# Patient Record
Sex: Female | Born: 1990 | Race: Black or African American | Hispanic: No | Marital: Single | Smoking: Never smoker
Health system: Southern US, Community
[De-identification: ages and names within clinical notes are randomized; demographics above are authoritative.]

---
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
## 2010-05-17 ENCOUNTER — Ambulatory Visit: Payer: Self-pay | Admitting: Family Medicine

## 2010-05-17 LAB — CONVERTED CEMR LAB

## 2010-06-02 ENCOUNTER — Encounter: Payer: Self-pay | Admitting: Family Medicine

## 2010-06-02 ENCOUNTER — Ambulatory Visit: Payer: Self-pay | Admitting: Family Medicine

## 2010-06-02 LAB — CONVERTED CEMR LAB
Chlamydia, DNA Probe: NEGATIVE
GC Probe Amp, Genital: NEGATIVE
Glucose, Urine, Semiquant: NEGATIVE
Nitrite: NEGATIVE
Urobilinogen, UA: 1
Whiff Test: POSITIVE

## 2010-06-04 ENCOUNTER — Encounter: Payer: Self-pay | Admitting: Family Medicine

## 2010-08-01 ENCOUNTER — Ambulatory Visit: Payer: Self-pay | Admitting: Family Medicine

## 2010-09-07 ENCOUNTER — Ambulatory Visit: Admission: RE | Admit: 2010-09-07 | Discharge: 2010-09-07 | Payer: Self-pay | Source: Home / Self Care

## 2010-10-05 NOTE — Assessment & Plan Note (Signed)
Summary: ? 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 Only one diflucan rx was given to pt.   Vital Signs:  Patient profile:   20 year old female Height:      62 inches Weight:      159 pounds BMI:     29.19 Temp:     98.3 degrees F oral Pulse rate:   76 / minute BP sitting:   110 / 72  (left arm) Cuff size:   regular  Vitals Entered By: Tessie Fass CMA (May 17, 2010 3:14 PM) CC: vag diacharge Is Patient Diabetic? No Pain Assessment Patient in pain? no        Primary Provider:  . [facility]  CC:  vag diacharge.  History of Present Illness: Pt has been having vaginal discharge for 1-2 months, but was initially trying natural remedies such as yogurt, which did not seem to treat the problem. She has some vaginal itching and some discharge. Noncopious, but very foul smelling when she has it. Yellow in color. She is sexually active with one partner and uses condoms. She has no conerns for STDs. Has not noticed any lesions or warts. She was last treated for BV this past spring and has previously had both yeast and BV infections.   Current Problems (verified): 1)  Bacterial Vaginitis  (ICD-616.10) 2)  Candidiasis of Vulva and Vagina  (ICD-112.1) 3)  Vaginal Discharge  (ICD-623.5) 4)  Contact or Exposure To Other Viral Diseases  (ICD-V01.79) 5)  Contraceptive Management  (ICD-V25.09) 6)  Preventive Health Care  (ICD-V70.0) 7)  Eczema, Atopic Dermatitis  (ICD-691.8) 8)  Acne  (ICD-706.1)  Current Medications (verified): 1)  Sprintec 28 0.25-35 Mg-Mcg Tabs (Norgestimate-Eth Estradiol) .... One Daily 2)  Fluconazole 150 Mg Tabs (Fluconazole) .... Take One Pill By Mouth Today 3)  Metronidazole 500 Mg Tabs (Metronidazole) .Marland Kitchen.. 1 Tab By Mouth Two Times A Day X7 Days  Allergies (verified): No Known Drug Allergies  Physical Exam  General:  Well-developed,well-nourished,in no acute distress; alert,appropriate  and cooperative throughout examination Mouth:  good dentition and pharynx pink and moist.   Lungs:  normal respiratory effort and normal breath sounds.   Heart:  normal rate, regular rhythm, and no murmur.   Abdomen:  soft, non-tender, normal bowel sounds, no distention, and no masses.   Genitalia:  normal introitus, no external lesions, mucosa pink and moist, no vaginal or cervical lesions, normal uterus size and position, no adnexal masses or tenderness, and vaginal discharge.     Impression & Recommendations:  Problem # 1:  BACTERIAL VAGINITIS (ICD-616.10) Assessment New Clue cells on wet prep consistent with BV. Pt has had previously.   The following medications were removed from the medication list:    Metrogel-vaginal 0.75 % Gel (Metronidazole) ..... One applicatorful (37.5mg ) in vagina two times a day x 5 days.  qs Her updated medication list for this problem includes:    Metronidazole 500 Mg Tabs (Metronidazole) .Marland Kitchen... 1 tab by mouth two times a day x7 days  Orders: [facility]- Est Level  3 (99213) [facility]- Est Level  3 (99213)  Problem # 2:  CANDIDIASIS OF VULVA AND VAGINA (ICD-112.1) Assessment: New Yeast on wet prep.   The following medications were removed from the medication list:    Fluconazole 150 Mg Tabs (Fluconazole) .Marland Kitchen... 1 by mouth x 1, may repeat in 3 days if symptoms unchanged Her updated medication list for this problem includes:    Fluconazole 150 Mg  Tabs (Fluconazole) .Marland Kitchen... Take one pill by mouth today  Orders: Wet Prep- [facility] (87210) [facility]- Est Level  3 (99213) [facility]- Est Level  3 (11914)  Problem # 3:  VAGINAL DISCHARGE (ICD-623.5)  Wet prep with yeast and clue cells. Treating with diflucan 150mg  by mouth x1 and metronidazole 500mg  by mouth two times a day x7days.   No concern for STDs per pt. Pt denied wanting STD testing.   Orders: [facility]- Est Level  3 (99213) [facility]- Est Level  3 (78295)  The following medications were removed from the medication list:     Metrogel-vaginal 0.75 % Gel (Metronidazole) ..... One applicatorful (37.5mg ) in vagina two times a day x 5 days.  qs  Problem # 4:  CONTRACEPTIVE MANAGEMENT (ICD-V25.09) Assessment: Unchanged Pt never filled last Sprintec Rx but would like to try it now. Had previously been using NuvaRing, but wants OCP.   Complete Medication List: 1)  Sprintec 28 0.25-35 Mg-mcg Tabs (Norgestimate-eth estradiol) .... One daily 2)  Fluconazole 150 Mg Tabs (Fluconazole) .... Take one pill by mouth today 3)  Metronidazole 500 Mg Tabs (Metronidazole) .Marland Kitchen.. 1 tab by mouth two times a day x7 days  Patient Instructions: 1)  It looks like you have both a yeast infection and bacterial vaginosis. I am giving you two medicines. The one that you take one pill one time may take a few days to start working. Please take the other medicine twice a day for the entire 7 days. 2)  Take your birth control pill at the same time every day in order to prevent pregnancy.  Birth control pills do not prevent STDs so continue to use condoms.  Prescriptions: FLUCONAZOLE 150 MG TABS (FLUCONAZOLE) Take one pill by mouth today  #1 x 0   Entered and Authorized by:   Demetria Pore MD   Signed by:   Demetria Pore MD on 05/17/2010   Method used:   Print then Give to Patient   RxID:   [id number] FLUCONAZOLE 150 MG TABS (FLUCONAZOLE) Take one pill by mouth today  #1 x 0   Entered and Authorized by:   Demetria Pore MD   Signed by:   Demetria Pore MD on 05/17/2010   Method used:   Print then Give to Patient   RxID:   [id number] SPRINTEC 28 0.25-35 MG-MCG TABS (NORGESTIMATE-ETH ESTRADIOL) one daily  #1 x 11   Entered and Authorized by:   Demetria Pore MD   Signed by:   Demetria Pore MD on 05/17/2010   Method used:   Print then Give to Patient   RxID:   [id number] METRONIDAZOLE 500 MG TABS (METRONIDAZOLE) 1 tab by mouth two times a day x7 days  #14 x 0   Entered and Authorized by:   Demetria Pore MD    Signed by:   Demetria Pore MD on 05/17/2010   Method used:   Print then Give to Patient   RxID:   [id number] FLUCONAZOLE 150 MG TABS (FLUCONAZOLE) Take one pill by mouth today  #1 x 0   Entered and Authorized by:   Demetria Pore MD   Signed by:   Demetria Pore MD on 05/17/2010   Method used:   Electronically to        [facility] [address] #[id number]* (retail)       [address] [place], [place]  [address]       Ph: [phone]       Fax:  [phone]   RxID:   [id number]   Laboratory Results  Date/Time Received: May 17, 2010 3:53 PM  Date/Time Reported: May 17, 2010 4:03 PM   [employer] Source: vaginal WBC/hpf: 1-5 Bacteria/hpf: 3+  Cocci Clue cells/hpf: moderate  Positive whiff Yeast/hpf: few Trichomonas/hpf: none Comments: ...........test performed by...........Marland KitchenTerese Door, CMA

## 2010-10-05 NOTE — Assessment & Plan Note (Signed)
Summary: yeast inf,df   Vital Signs:  Patient profile:   20 year old female Height:      62 inches Weight:      159 pounds BMI:     29.19 Temp:     98.7 degrees F oral Pulse rate:   69 / minute BP sitting:   105 / 68  (left arm) Cuff size:   regular  Vitals Entered By: Tessie Fass CMA (November 23, 2009 9:50 AM) CC: yeast infection? Is Patient Diabetic? No Pain Assessment Patient in pain? no        Primary Care Provider:  Eustaquio Boyden  MD  CC:  yeast infection?.  History of Present Illness: CC: yeast?  2 wks ago, odor and itchy vaginally.  + white clunky discharge.  No abx recently.  No h/o STIs.  Not on BC.  Currently sexually active, using protection.  Has had 3 yeast/BV infections in last year.  Habits & Providers  Alcohol-Tobacco-Diet     Tobacco Status: never  Allergies (verified): No Known Drug Allergies  Past History:  Past Medical History: Reviewed history from 05/05/2008 and no changes required. h/o severe ear infx-R ear  h/o recurrent yeast and BV infections  Physical Exam  General:  Well-developed,well-nourished,in no acute distress; alert,appropriate and cooperative throughout examination Genitalia:  Normal introitus for age, no external lesions, + white vaginal discharge, mucosa pink and moist, no vaginal or cervical lesions, no vaginal atrophy, no friaility or hemorrhage, normal uterus size and position, no adnexal masses or tenderness   Impression & Recommendations:  Problem # 1:  VAGINAL DISCHARGE (ICD-623.5)  wet prep consistent with mostly BV but candida.  tx with diflucan x 2.  metronidazole as well.  rtc as needed. Orders: Wet Prep- [facility] [id number]) [facility]- Est Level  3 ([id number])  Her updated medication list for this problem includes:    Metrogel-vaginal 0.75 % Gel (Metronidazole) ..... One applicatorful (37.5mg ) in vagina two times a day x 5 days.  qs  Problem # 2:  CONTRACEPTIVE MANAGEMENT (ICD-V25.09) discussed improtance of BC to  prevent pregnancies.  Complete Medication List: 1)  Fluconazole 150 Mg Tabs (Fluconazole) .Marland Kitchen.. 1 by mouth x 1, may repeat in 3 days if symptoms unchanged 2)  Sprintec 28 0.25-35 Mg-mcg Tabs (Norgestimate-eth estradiol) .... One daily 3)  Metrogel-vaginal 0.75 % Gel (Metronidazole) .... One applicatorful (37.5mg ) in vagina two times a day x 5 days.  qs  Patient Instructions: 1)  Looks like you have another vaginal yeast infection. 2)  Treat with diflucan x 1, may repeat in 4 days. 3)  Call clinic with questions. 4)  Good to see you today. Prescriptions: METROGEL-VAGINAL 0.75 % GEL (METRONIDAZOLE) one applicatorful (37.5mg ) in vagina two times a day x 5 days.  QS  #1 x 0   Entered and Authorized by:   Eustaquio Boyden  MD   Signed by:   Eustaquio Boyden  MD on 11/23/2009   Method used:   Electronically to        [facility] [address] #[id number]* (retail)       [address] [place], [place]  [address]       Ph: [phone]       Fax: [phone]   RxID:   [id number] FLUCONAZOLE 150 MG TABS (FLUCONAZOLE) 1 by mouth x 1, may repeat in 3 days if symptoms unchanged  #2 x 0   Entered and Authorized by:   Eustaquio Boyden  MD  Signed by:   Eustaquio Boyden  MD on 11/23/2009   Method used:   Electronically to        [facility] [address] #[id number]* (retail)       [address] [place], [place]  [address]       Ph: [phone]       Fax: [phone]   RxID:   [id number]   Laboratory Results  Date/Time Received: November 23, 2009 10:15 AM  Date/Time Reported: November 23, 2009 10:35 AM   Wet [place] Source: vag WBC/hpf: >20 Bacteria/hpf: 3+  Cocci Clue cells/hpf: many  Positive whiff Yeast/hpf: few Trichomonas/hpf: none Comments: ...............test performed by......Marland KitchenBonnie A. [place], MLS (ASCP)cm

## 2010-10-05 NOTE — Assessment & Plan Note (Signed)
Summary: confirm pregnancy/eo   Vital Signs:  Patient profile:   20 year old female LMP:     07/04/2010 Height:      62 inches Weight:      153 pounds BMI:     28.09 Pulse rate:   77 / minute BP sitting:   116 / 68  (right arm) Cuff size:   regular  Vitals Entered By: Tessie Fass CMA (September 07, 2010 1:43 PM) CC: upreg Pain Assessment Patient in pain? no      LMP (date): 07/04/2010 EDC by LMP==> 04/10/2011 EDC 04/10/2011 LMP - Character: normal LMP - Reliable? Yes     On BCP's at conception: yes Date of + home preg. test: 08/03/2010 Enter LMP: 07/04/2010   Primary Care Provider:  . [facility]  CC:  upreg.  History of Present Illness: Leslie Hopkins presents to clinic today to discuss her positive home pregnancy test.  She had her LMP at the end of October and early november. She tested positive by self pregnancy test on Dec1st. She was using birth control pills at the time of concecption however she was not taking them reliably. She is not sure if she wants to keep her baby but has not called planned parenthood yet.  This is her first pregnancy.   Habits & Providers  Alcohol-Tobacco-Diet     Tobacco Status: current  Current Problems (verified): 1)  Pregnant State, Incidental  (ICD-V22.2) 2)  Contact or Exposure To Other Viral Diseases  (ICD-V01.79) 3)  Contraceptive Management  (ICD-V25.09) 4)  Preventive Health Care  (ICD-V70.0) 5)  Eczema, Atopic Dermatitis  (ICD-691.8) 6)  Acne  (ICD-706.1)  Current Medications (verified): 1)  Metrogel-Vaginal 0.75 % Gel (Metronidazole) .... One Applicator At Bedtime For 3-5 Nights As Needed For Symptoms 2)  Prenatal Vitamins 0.8 Mg Tabs (Prenatal Multivit-Min-Fe-Fa)  Allergies (verified): No Known Drug Allergies  Past History:  Past Medical History: Last updated: 05/05/2008 h/o severe ear infx-R ear  h/o recurrent yeast and BV infections  Family History: Last updated: 10/31/2006 no h/o CAD, DM,  Ca  Social History: Last updated: 09/16/2008 Lives with mom, siblings, GM, aunt, uncle and cousin.  lived in [place] and [place] growing up.  No EtOH, smoking, rec drugs, sexually active since 2007, in monogamous relationship, uses protection ShantEEa  Risk Factors: Smoking Status: current (09/07/2010)  Review of Systems  The patient denies anorexia, fever, weight loss, chest pain, syncope, dyspnea on exertion, abdominal pain, severe indigestion/heartburn, difficulty walking, and abnormal bleeding.    Physical Exam  General:  Well-developed,well-nourished,in no acute distress; alert,appropriate and cooperative throughout examination Psych:  Cognition and judgment appear intact. Alert and cooperative with normal attention span and concentration. No apparent delusions, illusions, hallucinations   Impression & Recommendations:  Problem # 1:  PREGNANT STATE, INCIDENTAL (ICD-V22.2) Assessment New  Discussed at length the options in early pregnancy. She is currently undecided if she will have an abortion. I gave her the contact details for planned parenthood and asked her to talk to the people over there about all the options.   She will return to clinic in a few weeks after making her decision.  Is she decides to keep the baby she will come back for prenatal labs and starting prenatal care.  Is she decides to have an abortion she will come back for contraception management.  She is thinking about the implinon.   Orders: [facility]- Est Level  3 (04540)  Complete Medication List: 1)  Metrogel-vaginal 0.75 %  Gel (Metronidazole) .... One applicator at bedtime for 3-5 nights as needed for symptoms 2)  Prenatal Vitamins 0.8 Mg Tabs (Prenatal multivit-min-fe-fa)  Other Orders: U Preg[facility] ([id number])  Patient Instructions: 1)  Thank you for seeing me today. 2)  Please call planned parenthood to talk about your options.  3)  Start taking prenatal vitamins now or if they make you sick you can  take 2 gummy vitamins or 2 flintstones vitamins.  4)  Once you have made your decision come one back to either start prenatal care or to choose a more effective form of birth control.    Orders Added: 1)  [facility] Preg[facility] [[id number]] 2)  [facility]- Est Level  3 [99213]    Laboratory Results   Urine Tests  Date/Time Received: September 07, 2010 1:45 PM  Date/Time Reported: September 07, 2010 1:47 PM     Urine HCG: positive Comments: ...........test performed by...........Marland KitchenTerese Door, CMA       Flowsheet View for Follow-up Visit    Estimated weeks of       gestation:     9 2/7    Weight:     153    Blood pressure:   116 / 68  Prenatal Visit EDC Confirmation:    New working [facility]: 04/10/2011    LMP reliable? Yes    Last menses onset (LMP) date: 07/04/2010    EDC by LMP: 04/10/2011   Past Pregnancy History    Gravida:     1    Para:       0  Pregnancy # 1    Comments:     Current   OB Initial Intake Information    Positive HCG by: school    Race: Black    Marital status: Single    Number of children at home: 0  Menstrual History    LMP (date): 07/04/2010    EDC by LMP: 04/10/2011    Best Working EDC: 04/10/2011    LMP - Character: normal    LMP - Reliable? : Yes    On BCP's at conception: yes    Date of positive (+) home preg. test: 08/03/2010

## 2010-10-05 NOTE — Assessment & Plan Note (Signed)
Summary: bv?,df   Vital Signs:  Patient profile:   20 year old female Height:      62 inches Weight:      157.2 pounds BMI:     28.86 Temp:     98.5 degrees F oral Pulse rate:   69 / minute BP sitting:   116 / 65  (left arm) Cuff size:   regular  Vitals Entered By: Jimmy Footman, CMA (August 01, 2010 3:12 PM) CC: bv sxs for 5 days Is Patient Diabetic? No Pain Assessment Patient in pain? no        Primary Care Chevelle Coulson:  . [facility]  CC:  bv sxs for 5 days.  History of Present Illness: recurrent BV, has mainly used oral antibotics, she tried the gel once and did not like it.  generally gets yeast when she is treated.  Boyfriend in room, she made him accompany her, he did not want to.  Habits & Providers  Alcohol-Tobacco-Diet     Tobacco Status: current  Current Medications (verified): 1)  Sprintec 28 0.25-35 Mg-Mcg Tabs (Norgestimate-Eth Estradiol) .... One Daily 2)  Metrogel-Vaginal 0.75 % Gel (Metronidazole) .... One Applicator At Bedtime For 3-5 Nights As Needed For Symptoms  Allergies: No Known Drug Allergies  Social History: Smoking Status:  current  Physical Exam  General:  Well-developed,well-nourished,in no acute distress; alert,appropriate and cooperative throughout examination Genitalia:  Normal introitus for age, no external lesions, no vaginal discharge, mucosa pink and moist, no vaginal or cervical lesions, no vaginal atrophy, no friaility or hemorrhage, normal uterus size and position, no adnexal masses or tenderness   Impression & Recommendations:  Problem # 1:  UNSPECIFIED VAGINITIS AND VULVOVAGINITIS (ICD-616.10) Very few clues on wet mount, discussed patient is focusing too much on this.  Tried to discuss as chronic in nature and comes and goes.  Trial of Metrogel to treat herself when she becomes symptomatic.  Explained need to drink water and keep urine flushed. The following medications were removed from the medication list:    Cleocin  300 Mg Caps (Clindamycin hcl) .Marland Kitchen... 1 cap by mouth two times a day x 7 days Her updated medication list for this problem includes:    Metrogel-vaginal 0.75 % Gel (Metronidazole) ..... One applicator at bedtime for 3-5 nights as needed for symptoms  Orders: Wet Prep- [facility] (87210) [facility]- Est Level  3 (23557)  Complete Medication List: 1)  Sprintec 28 0.25-35 Mg-mcg Tabs (Norgestimate-eth estradiol) .... One daily 2)  Metrogel-vaginal 0.75 % Gel (Metronidazole) .... One applicator at bedtime for 3-5 nights as needed for symptoms  Patient Instructions: 1)  Drink more water 2)  use the gel as directed Prescriptions: METROGEL-VAGINAL 0.75 % GEL (METRONIDAZOLE) one applicator at bedtime for 3-5 nights as needed for symptoms  #1 x 1   Entered and Authorized by:   Luretha Murphy NP   Signed by:   Luretha Murphy NP on 08/01/2010   Method used:   Electronically to        [facility] [address] [id number]* (retail)       [address] [place], [place]  [address]       Ph: [phone]       Fax: [phone]   RxID:   [id number]    Orders Added: 1)  Wet Prep- [facility] [87210] 2)  [facility]- Est Level  3 [[id number]]    Laboratory Results  Date/Time Received: August 01, 2010 3:50 PM  Date/Time Reported:  August 01, 2010 4:06 PM   [employer] Source: vaginal WBC/hpf: 10-15 Bacteria/hpf: 3+ Clue cells/hpf: few Yeast/hpf: few Trichomonas/hpf: none Comments: ...........test performed by...........Marland KitchenTerese Door, CMA

## 2010-10-05 NOTE — Assessment & Plan Note (Signed)
Summary: ? bacterial infection,tcb   Vital Signs:  Patient profile:   20 year old female Height:      62 inches Weight:      160.4 pounds BMI:     29.44 Temp:     98.5 degrees F oral Pulse rate:   66 / minute BP sitting:   112 / 66  (left arm) Cuff size:   regular  Vitals Entered By: Garen Grams LPN (June 02, 2010 11:32 AM) CC: still having discharge with odor and cloudy urine Is Patient Diabetic? No Pain Assessment Patient in pain? no        Primary Care Provider:  . [facility]  CC:  still having discharge with odor and cloudy urine.  History of Present Illness: 20 y/o F here for vaginal odor.   Malodorous vaginal discharge x 3 days.  Treated 2 wks ago for BV + Yeast.  No puritis.  No pain with sex.  NO abd pain, fever, chills.  Sexually active with inconsistent condom use.  Just found out that boyfriend has cheated on her.     Urinary frequencey:  Urine with cloudy color.  No dysuria.  + frequency.  No abd pain, fever, chills.    Contraception: OCP, just started taking it.  Does not desire fertility at this time.    Habits & Providers  Alcohol-Tobacco-Diet     Tobacco Status: never  Current Medications (verified): 1)  Sprintec 28 0.25-35 Mg-Mcg Tabs (Norgestimate-Eth Estradiol) .... One Daily 2)  Cleocin 300 Mg Caps (Clindamycin Hcl) .Marland Kitchen.. 1 Cap By Mouth Two Times A Day X 7 Days  Allergies (verified): No Known Drug Allergies  Past History:  Past Medical History: Last updated: 05/05/2008 h/o severe ear infx-R ear  h/o recurrent yeast and BV infections  Family History: Last updated: 10/31/2006 no h/o CAD, DM, Ca  Social History: Last updated: 09/16/2008 Lives with mom, siblings, GM, aunt, uncle and cousin.  lived in [place] and [place] growing up.  No EtOH, smoking, rec drugs, sexually active since 2007, in monogamous relationship, uses protection Leslie Hopkins  Risk Factors: Smoking Status: never (06/02/2010)  Review of Systems General:   Denies chills and fever. GI:  Denies abdominal pain, bloody stools, nausea, and vomiting. GU:  Complains of discharge and hematuria; denies abnormal vaginal bleeding, dysuria, and genital sores.  Physical Exam  General:  Well-developed,well-nourished,in no acute distress; alert,appropriate and cooperative throughout examination. vitals reviewed.  Genitalia:  Normal introitus for age, no external lesions, no vaginal discharge, mucosa pink and moist, no vaginal or cervical lesions, no vaginal atrophy, no friaility or hemorrhage, normal uterus size and position, no adnexal masses or tenderness   Impression & Recommendations:  Problem # 1:  BACTERIAL VAGINITIS (ICD-616.10) Wet prep still with clue cells.  Pt has been treated with Flagyl on 05/17/10 and in the past.  Will treat with clindamycien this time.   Orders: Wet Prep- [facility] [phone]) [facility]- Est  Level 4 (60454)  The following medications were removed from the medication list:    Metronidazole 500 Mg Tabs (Metronidazole) .Marland Kitchen... 1 tab by mouth two times a day x7 days Her updated medication list for this problem includes:    Cleocin 300 Mg Caps (Clindamycin hcl) .Marland Kitchen... 1 cap by mouth two times a day x 7 days  Problem # 2:  CANDIDIASIS OF VULVA AND VAGINA (ICD-112.1) Assessment: Improved None on wet prep.  The following medications were removed from the medication list:    Fluconazole 150 Mg Tabs (  Fluconazole) .Marland Kitchen... Take one pill by mouth today  Orders: Wet Prep- [facility] [phone]) GC/Chlamydia-[facility] (87591/87491) HIV-[facility] (32440-10272) RPR-[facility] [phone]) [facility]- Est  Level 4 (42595)  Problem # 3:  URINARY FREQUENCY (ICD-788.41) Assessment: New UA: negative.  UPT negative.  Likely from BV.   Orders: Urinalysis-[facility] (00000) U Preg-[facility] (81025) [facility]- Est  Level 4 (99214)  Problem # 4:  CONTACT OR EXPOSURE TO OTHER VIRAL DISEASES (ICD-V01.79) Assessment: New Just found out that boyfriend was having relationship with another lady.  Will check for  GC/Chlam, HIV, RPR.   Orders: [facility]- Est  Level 4 (99214)  Complete Medication List: 1)  Sprintec 28 0.25-35 Mg-mcg Tabs (Norgestimate-eth estradiol) .... One daily 2)  Cleocin 300 Mg Caps (Clindamycin hcl) .Marland Kitchen.. 1 cap by mouth two times a day x 7 days Prescriptions: CLEOCIN 300 MG CAPS (CLINDAMYCIN HCL) 1 cap by mouth two times a day x 7 days  #14 x 0   Entered and Authorized by:   Angeline Slim MD   Signed by:   Angeline Slim MD on 06/02/2010   Method used:   Electronically to        [facility] [address] [id number] (retail)       [address] [place], [place]  [address]       Ph: [phone]       Fax: [phone]   RxID:   [id number]   Laboratory Results   Urine Tests  Date/Time Received: June 02, 2010 11:58 AM  Date/Time Reported: June 02, 2010 2:59 PM   Routine Urinalysis   Color: yellow Appearance: Clear Glucose: negative   (Normal Range: Negative) Bilirubin: negative   (Normal Range: Negative) Ketone: small (15)   (Normal Range: Negative) Spec. Gravity: >=1.030   (Normal Range: 1.003-1.035) Blood: trace-lysed   (Normal Range: Negative) pH: 6.0   (Normal Range: 5.0-8.0) Protein: trace   (Normal Range: Negative) Urobilinogen: 1.0   (Normal Range: 0-1) Nitrite: negative   (Normal Range: Negative) Leukocyte Esterace: negative   (Normal Range: Negative)  Urine Microscopic WBC/HPF: 5-10 RBC/HPF: 1-4 Bacteria/HPF: 3+ Mucous/HPF: 3+ Epithelial/HPF: 1-5    Urine HCG: negative Comments: ...............test performed by......Marland KitchenBonnie A. Swaziland, MLS (ASCP)cm  Date/Time Received: June 02, 2010 11:58 PM  Date/Time Reported: June 02, 2010 12:12 PM   [employer] Source: vaginal WBC/hpf: 5-10 Bacteria/hpf: 3+ Clue cells/hpf: few  Positive whiff Yeast/hpf: none Trichomonas/hpf: none Comments: ...........test performed by...........Marland KitchenTerese Door, CMA

## 2010-10-05 NOTE — Letter (Signed)
Summary: Lab Result  [facility]  [address]   [place], [place] [address]   Phone: [phone]  Fax: [phone]    06/04/2010  LEXA CORONADO [address] [place], [place]  [address]  Dear Ms. Henion,  Your lab results are:  Chlamydia:  Negative  Gonorrhea: Negative  HIV: Negative  Syphilis:  Negative     Sincerely,   Cat Ta MD

## 2010-10-22 ENCOUNTER — Emergency Department (HOSPITAL_COMMUNITY)
Admission: EM | Admit: 2010-10-22 | Discharge: 2010-10-22 | Disposition: A | Discharge status: Home / Self Care | Payer: Self-pay | Attending: Emergency Medicine | Admitting: Emergency Medicine

## 2010-10-23 ENCOUNTER — Encounter: Payer: Self-pay | Admitting: *Deleted

## 2010-11-06 ENCOUNTER — Other Ambulatory Visit: Payer: Self-pay | Admitting: Family Medicine

## 2010-11-06 ENCOUNTER — Encounter: Payer: Self-pay | Admitting: Family Medicine

## 2010-11-06 ENCOUNTER — Ambulatory Visit (INDEPENDENT_AMBULATORY_CARE_PROVIDER_SITE_OTHER): Payer: Medicaid Other | Admitting: Family Medicine

## 2010-11-06 VITALS — BP 110/70 | HR 72 | Wt 159.7 lb

## 2010-11-06 DIAGNOSIS — N898 Other specified noninflammatory disorders of vagina: Secondary | ICD-10-CM

## 2010-11-06 DIAGNOSIS — IMO0001 Reserved for inherently not codable concepts without codable children: Secondary | ICD-10-CM

## 2010-11-06 DIAGNOSIS — Z309 Encounter for contraceptive management, unspecified: Secondary | ICD-10-CM | POA: Insufficient documentation

## 2010-11-06 LAB — POCT WET PREP (WET MOUNT): Trichomonas Wet Prep HPF POC: NEGATIVE

## 2010-11-06 LAB — CONVERTED CEMR LAB: Chlamydia, DNA Probe: NEGATIVE

## 2010-11-06 MED ORDER — FLUCONAZOLE 150 MG PO TABS: 150.0000 mg | ORAL_TABLET | Freq: Once | ORAL | Status: AC

## 2010-11-06 MED ORDER — NORETHIN ACE-ETH ESTRAD-FE 1-20 MG-MCG(24) PO TABS: 1.0000 | ORAL_TABLET | Freq: Every day | ORAL | Status: DC

## 2010-11-06 MED ORDER — METRONIDAZOLE 500 MG PO TABS: 500.0000 mg | ORAL_TABLET | Freq: Two times a day (BID) | ORAL | Status: AC

## 2010-11-06 NOTE — Assessment & Plan Note (Signed)
Wet prep, GC/Chlam done today.  Wet prep showed moderate clue cells and yeast.  Will treat with flagyl 500mg  bid x 7 days and Diflucan 150mg  x 1.

## 2010-11-06 NOTE — Assessment & Plan Note (Signed)
UPT negative today.  Will place pt on Loestrin 24 daily.  She can start on it today.  She will make appt for Implanon insertion.  Will give reading material on Implanon.

## 2010-11-06 NOTE — Progress Notes (Signed)
  Subjective:    Patient ID: Leslie Hopkins, female    DOB: [birth year], 20 y.o.   MRN: [id number]  HPI Vaginal discharge: 1 1/2 weeks.  Had an elective abortion in mid Jaunuary 2012.  Afterwards she had bleeding and cramping x 1 wk.  She had menses end of Feb, LMP 10/28/10, finished 3 days ago.  She noticed vaginal discharge right before period started.  She has history of BV and it felt like that.  She is sexually active, and denies pain with intercourse.  Some itchiness.  No abd vaginal bleeding.    Contraception: She is taking OCP.  She was given one month of OCP from the abortion clinic.  She desires Implanon for longterm contraception.  She does not desire pregnancy as she is in college at this time.     Review of Systems No fever, chills, nausea, vomiting, abd pain.      Objective:   Physical Exam GEN: nad, alert, appropriate throughout exam GU: Normal introitus for age, no external lesions, no vaginal discharge, mucosa pink and moist, no vaginal or cervical lesions, no vaginal atrophy, no friaility or hemorrhage, normal uterus size and position, no adnexal masses or tenderness +vaginal discharge    Assessment & Plan:

## 2010-11-06 NOTE — Patient Instructions (Signed)
Please make a follow-up appointment for Insertion of Implanon.  This will require at least a 30 minute appointment.  You may want to take some Advil 1 hour prior to appointment time.   IMPORTANT: HOW TO USE THIS INFORMATION:  This is a summary and does NOT have all possible information about this product. This information does not assure that this product is safe, effective, or appropriate for you. This information is not individual medical advice and does not substitute for the advice of your health care professional. Always ask your health care professional for complete information about this product and your specific health needs.    ETONOGESTREL - IMPLANT (et-oh-no-GES-trel)    COMMON BRAND NAME(S): Implanon    USES:  This medication is used to prevent pregnancy. It is a thin plastic rod about the size of a matchstick that is inserted under the skin by a health care professional. The rod slowly releases etonogestrel into the body over a 3-year period. The rod must be removed after 3 years and can be replaced if continued birth control is desired. The rod can be removed at any time by a trained health care professional if birth control is no longer desired or there are side effects. It does not contain any estrogen. Etonogestrel (a form of progestin) is a hormone that prevents pregnancy by preventing the release of an egg (ovulation) and by changing the womb and cervical mucus to make it more difficult for an egg to meet sperm (fertilization) or for the fertilized egg to attach to the wall of the womb (implantation). This medication may not work as well in women who are very overweight or those taking certain drugs. (See also Drug Interactions section.) Discuss your birth control options with your doctor. Using this medication does not protect you or your partner against sexually transmitted diseases (e.g., HIV, gonorrhea).    HOW TO USE:  Read the Patient Information Leaflet provided by your pharmacist  or health care provider before the rod is placed. Read and sign the Informed Consent provided by your doctor. You will also be given a User Card with the date and the place on your body where the rod was inserted. Keep the card and use it to remind yourself when to schedule an appointment to have the rod removed. If you have any questions, ask your doctor or pharmacist. Ask your doctor about the best time to schedule your appointment to have the rod placed. Your doctor may want you to have a pregnancy test first. The medication usually starts working right away when the rod is inserted on days 1 through 5 after the start of your regular menstrual bleeding. If your appointment is at another time in your menstrual cycle, you may need to use a non-hormonal form of birth control (e.g., condoms, diaphragm, spermicide) for the first 7 days after the rod is placed. Ask your doctor about whether you need back-up birth control. The rod will be inserted into the skin in your upper arm. Usually it will be placed in the arm opposite the side you write with. Be sure you can feel the rod underneath your skin after it has been placed. There will be 2 bandages covering the area where the rod is placed. Leave the top bandage on for 24 hours. Keep the smaller bandage on for 3-5 days or as directed by your doctor. Keep the bandage clean and dry.    SIDE EFFECTS:  Nausea, stomach cramping/bloating, dizziness, headache, breast tenderness, acne,  hair loss, weight gain, and vaginal irritation/discharge may occur. Pain, bruising, numbness, infection, and scarring may occur at the site where the rod is placed. If any of these effects persist or worsen, notify your doctor promptly. Your periods may be early or late, shorter or longer, heavier or lighter than normal. You may also have some spotting between periods, especially during the first several months of use. If bleeding is prolonged (more than 8 days) or unusually heavy, contact your  doctor. If you miss 2 periods in a row, contact your doctor for a pregnancy test. Remember that your doctor has prescribed this medication because he or she has judged that the benefit to you is greater than the risk of side effects. Many people using this medication do not have serious side effects. The rod must be removed after 3 years. This is usually a simple procedure done in your doctor's office. Rarely (e.g., if the rod has been placed too deeply or can't be felt), the rod may require surgery to remove. Tell your doctor immediately if any of these unlikely but serious side effects occur: depression, unwanted facial/body hair. This medication may rarely cause serious (sometimes fatal) problems from blood clots (e.g., pulmonary embolism, stroke, heart attack). Seek immediate medical attention if you experience: sudden shortness of breath, chest/jaw/left arm pain, confusion, coughing up blood, sudden dizziness/fainting, pain/swelling/warmth in the groin/calf, tingling/weakness/numbness in the arms/legs, headaches that are different from those you may have experienced in the past (e.g., headaches with other symptoms such as vision changes/lack of coordination, existing migraines becoming worse, sudden/very severe headaches), slurred speech, weakness on one side of the body, vision problems/changes. Tell your doctor immediately if any of these rare but very serious side effects occur: severe stomach/abdominal/pelvic pain, lumps in the breast, unusual tiredness, dark urine, yellowing eyes/skin. A very serious allergic reaction to this drug is rare. However, seek immediate medical attention if you notice any symptoms of a serious allergic reaction, including: rash, itching/swelling (especially of the face/tongue/throat), severe dizziness, trouble breathing. This is not a complete list of possible side effects. If you notice other effects not listed above, contact your doctor or pharmacist. In the [place] - Call your  doctor for medical advice about side effects. You may report side effects to FDA at 1-800-FDA-1088. In [place] - Call your doctor for medical advice about side effects. You may report side effects to Health [place] at [phone].    PRECAUTIONS:  Before having the rod placed, tell your doctor or pharmacist if you are allergic to etonogestrel; or to other progestins; or to any anesthetics or antiseptics that might be used in the procedure; or if you have any other allergies. This product may contain inactive ingredients, which can cause allergic reactions or other problems. Talk to your pharmacist for more details. This medication should not be used if you have certain medical conditions. Before using this medicine, consult your doctor or pharmacist if you have: abnormal breast exam, breast cancer, heart attack/stroke/other blood clots (e.g., in the legs, eyes, lungs), liver problems (e.g., liver tumor, active liver disease), current or suspected pregnancy, unexplained vaginal bleeding. Before using this medication, tell your doctor or pharmacist your medical history, especially of: depression, high blood pressure, low levels of "good" cholesterol (HDL), diabetes, gall bladder disease, heart disease (e.g., chest pain), history of yellowing eyes/skin (jaundice) during pregnancy or while using birth control pills, migraine headaches, seizures, long periods of sitting or lying down (e.g., immobility such as being bedridden). Smoking cigarettes/using tobacco while  using hormonal birth control (implant/pill/patch/ring) increases your risk of heart problems and stroke. Do not smoke. The risk of heart problems increases with age (especially in women over 32) and with frequent smoking (15 or more cigarettes a day). Notify your doctor at least 4 weeks beforehand if you will be having surgery or will be confined to a chair/bed for a long time (e.g., a long plane flight). You may need to have this medication removed  temporarily or take special precautions at these times while you are using this drug. The drug in this implant may cause blotchy, dark areas on your skin (melasma). Sunlight may intensify this effect. If this occurs, avoid prolonged sun exposure, use a sunscreen, and wear protective clothing when outdoors. If you are nearsighted or wear contact lenses, you may develop vision problems or may have problems wearing your contact lenses. Contact your eye doctor if these problems occur. This product should not be used during pregnancy. If you become pregnant or think you may be pregnant, inform your doctor immediately. A certain serious pregnancy problem (ectopic pregnancy) may be more likely if you become pregnant while using this product. This medication passes into breast milk in small amounts. Consult your doctor before breast-feeding.    DRUG INTERACTIONS:  Your doctor or pharmacist may already be aware of any possible drug interactions and may be monitoring you for them. Do not start, stop, or change the dosage of any medicine before checking with your doctor or pharmacist first. This drug should not be used with the following medication because very serious interactions may occur: sodium tetradecyl sulfate. If you are currently using the medication listed above, tell your doctor or pharmacist before starting this medication. Before using this medication, tell your doctor or pharmacist of all prescription and nonprescription/herbal products you may use, especially of: azole antifungals (e.g., fluconazole, itraconazole). Certain drugs can decrease the effectiveness of birth control by decreasing the amount of birth control hormones in your system. This can result in pregnancy. These drugs include: aprepitant, bexarotene, bosentan, dapsone, felbamate, griseofulvin, certain HIV drugs (such as amprenavir, indinavir, nelfinavir, nevirapine, ritonavir, zidovudine), modafinil, phenylbutazone, rifamycins (e.g., rifampin),  many seizure medications (e.g., barbiturates, carbamazepine, phenytoin, topiramate), St. John's wort. Consult your doctor or pharmacist for details, and ask if you should use additional reliable birth control methods while taking any of the drugs listed above. This medication can affect the results of certain lab tests (e.g., sex-hormone-binding globulin, thyroid). Make sure laboratory personnel and all your doctors know you use this medication. This document does not contain all possible interactions. Therefore, before using this product, tell your doctor or pharmacist of all the products you use. Keep a list of all your medications with you, and share the list with your doctor and pharmacist.    OVERDOSE:  This medication may be harmful if swallowed. Overdose may occur if more than 1 rod is placed or the old rod is not removed when a new rod is placed. If overdose is suspected, contact your local poison control center or emergency room immediately. [place] residents can call the [place] National Poison Hotline at (830) 585-6005. [place] residents can call a Technical sales engineer center.    NOTES:  Keep all laboratory and medical appointments. You should have regular complete physical exams including blood pressure, breast exam, pelvic exam, and screening for cervical cancer (Pap smear). Follow your doctor's instructions for examining your own breasts, and report any lumps immediately. Consult your doctor for more details.    MISSED  DOSE:  The rod must be removed or replaced after 3 years.    STORAGE:  Store at room temperature at 77 degrees F (25 degrees C) away from light and moisture until insertion. Brief storage between 59-86 degrees F (15-30 degrees C) is permitted. Do not store in the bathroom. Keep all medicines away from children and pets. Do not flush medications down the toilet or pour them into a drain unless instructed to do so. Properly discard this product when it is expired or no longer needed.  Consult your pharmacist or local waste disposal company for more details about how to safely discard your product.    Information last revised September 2010 Copyright(c) 2010 [employer].

## 2010-11-09 LAB — GC/CHLAMYDIA PROBE AMP, GENITAL: Chlamydia, DNA Probe: NEGATIVE

## 2010-11-10 ENCOUNTER — Telehealth: Payer: Self-pay | Admitting: Family Medicine

## 2010-11-10 NOTE — Telephone Encounter (Signed)
Tried to call pt regarding GC/Chlam result but phone is not working. Will send letter.

## 2010-11-23 ENCOUNTER — Ambulatory Visit: Payer: Medicaid Other | Admitting: Family Medicine

## 2010-12-07 ENCOUNTER — Ambulatory Visit: Payer: Medicaid Other | Admitting: Family Medicine

## 2011-01-02 ENCOUNTER — Ambulatory Visit: Payer: Medicaid Other | Admitting: Family Medicine

## 2011-01-11 ENCOUNTER — Ambulatory Visit: Payer: Medicaid Other | Admitting: Family Medicine

## 2011-01-12 ENCOUNTER — Ambulatory Visit: Payer: Medicaid Other | Admitting: Family Medicine

## 2011-01-22 ENCOUNTER — Ambulatory Visit (INDEPENDENT_AMBULATORY_CARE_PROVIDER_SITE_OTHER): Payer: Medicaid Other | Admitting: Family Medicine

## 2011-01-22 ENCOUNTER — Other Ambulatory Visit: Payer: Self-pay | Admitting: Family Medicine

## 2011-01-22 ENCOUNTER — Encounter: Payer: Self-pay | Admitting: Family Medicine

## 2011-01-22 VITALS — BP 104/76 | HR 67 | Temp 97.7°F | Ht 62.6 in | Wt 161.0 lb

## 2011-01-22 DIAGNOSIS — N898 Other specified noninflammatory disorders of vagina: Secondary | ICD-10-CM

## 2011-01-22 DIAGNOSIS — B373 Candidiasis of vulva and vagina: Secondary | ICD-10-CM

## 2011-01-22 DIAGNOSIS — A499 Bacterial infection, unspecified: Secondary | ICD-10-CM

## 2011-01-22 DIAGNOSIS — B3731 Acute candidiasis of vulva and vagina: Secondary | ICD-10-CM | POA: Insufficient documentation

## 2011-01-22 DIAGNOSIS — B9689 Other specified bacterial agents as the cause of diseases classified elsewhere: Secondary | ICD-10-CM | POA: Insufficient documentation

## 2011-01-22 DIAGNOSIS — N760 Acute vaginitis: Secondary | ICD-10-CM

## 2011-01-22 DIAGNOSIS — M549 Dorsalgia, unspecified: Secondary | ICD-10-CM | POA: Insufficient documentation

## 2011-01-22 LAB — POCT WET PREP (WET MOUNT): Trichomonas Wet Prep HPF POC: NEGATIVE

## 2011-01-22 MED ORDER — FLUCONAZOLE 150 MG PO TABS: 150.0000 mg | ORAL_TABLET | Freq: Once | ORAL | Status: AC

## 2011-01-22 MED ORDER — METRONIDAZOLE 500 MG PO TABS: 500.0000 mg | ORAL_TABLET | Freq: Two times a day (BID) | ORAL | Status: AC

## 2011-01-22 NOTE — Telephone Encounter (Signed)
Message copied by Jimmy Footman on Mon Jan 22, 2011  1:43 PM ------      Message from: Clotilde Dieter, [place]      Created: Mon Jan 22, 2011 11:55 AM      Regarding: Please call this patient.        I have been trying to call her twice to let her know that I have sent in 2 prescriptions for her to treat BV and yeast infection. I can't get through. Can you try to call?             ----- Message -----         From: Bonnie Swaziland         Sent: 01/22/2011  11:13 AM           To: Jamie Brookes

## 2011-01-22 NOTE — Telephone Encounter (Signed)
Patient to call back

## 2011-01-22 NOTE — Assessment & Plan Note (Addendum)
Pt has a h/o BV. Some recent d/c x 3 weeks, thick mucus, no recent treatment with antibiotics, no h/o STI's, using pills for birth control and condoms sometimes. Had abortion in Jan. No new sexual partners.   Pt found to have BV and a yeast infection.

## 2011-01-22 NOTE — Assessment & Plan Note (Signed)
Pt has a h/o BV. Some recent d/c x 3 weeks, thick mucus, no recent treatment with antibiotics, no h/o STI's, using pills for birth control and condoms sometimes. Had abortion in Jan. No new sexual partners.   Pt found to have BV and a yeast infection.  

## 2011-01-22 NOTE — Assessment & Plan Note (Addendum)
Pt has a h/o BV. Some recent d/c x 3 weeks, thick mucus, no recent treatment with antibiotics, no h/o STI's, using pills for birth control and condoms sometimes. Had abortion in Jan. No new sexual partners.   Pt found to have BV on wet prep.

## 2011-01-22 NOTE — Progress Notes (Signed)
Vaginitis: Pt has had a discharge since the end of April. She has  A thick mucus. No new sexual partners. No h/o STI's, uses pills for birth control, uses condoms sometimes . Pt had an abortion in January. No problems. No fever.   Back pain: Pt has had some back pain since December when she was about 2 months pregnant. She thought it would go away after the abortion but she continues to have back pain. She has not lifted anything heavy, no injury, no pain in the spine. It has been hurting for 5 months now. Hurts worst when laying in the bed and going from laying to sitting or sitting to laying down. No bowel or bladder involvelment.   ROS:  Neg except as noted above.   PE:  Gen: NAD, sitting comfortably.  Back:No tenderness to palpation on the spine or paraspinal muscles. No SI joint tenderness.  ZS:WFUXN appears normal, no lesions, no abnormalities. Some scan white discharge with clumps.

## 2011-01-22 NOTE — Assessment & Plan Note (Signed)
No bowel or bladder involvement. No injury.  Pt has gained a few pounds but nothing significant. Back pain started after finding out she was pregnant but only carried the pregnancy to almost 3 months before terminating the pregnancy.  Back pain worse when laying down and getting up out of bed. No pain over spine.  Would like PT.

## 2011-01-22 NOTE — Telephone Encounter (Signed)
Spoke with patient and informed her.Leslie Hopkins

## 2011-01-22 NOTE — Patient Instructions (Signed)
We will call you with results.  I have put in a physical therapy referral for you.  We will work on setting up a time and date for you and let you know when your appointment it to take place.

## 2011-01-22 NOTE — Telephone Encounter (Signed)
Message copied by Jimmy Footman on Mon Jan 22, 2011  1:42 PM ------      Message from: Clotilde Dieter, [place]      Created: Mon Jan 22, 2011 11:55 AM      Regarding: Please call this patient.        I have been trying to call her twice to let her know that I have sent in 2 prescriptions for her to treat BV and yeast infection. I can't get through. Can you try to call?             ----- Message -----         From: Bonnie Swaziland         Sent: 01/22/2011  11:13 AM           To: Jamie Brookes

## 2011-01-23 ENCOUNTER — Telehealth: Payer: Self-pay | Admitting: Family Medicine

## 2011-01-23 ENCOUNTER — Telehealth: Payer: Self-pay | Admitting: *Deleted

## 2011-01-23 LAB — GC/CHLAMYDIA PROBE AMP, GENITAL: GC Probe Amp, Genital: NEGATIVE

## 2011-01-23 NOTE — Telephone Encounter (Signed)
Due to patient's phone number being out of service I have called the emergency contact number and left a message for patient to call back. When and if she calls back please give her below message. If I am unable to reach her, a letter will be sent to her.Leslie Hopkins

## 2011-01-23 NOTE — Telephone Encounter (Signed)
Pt call and given msg.  Will call back to sched appt. Advise to get in asap

## 2011-01-23 NOTE — Telephone Encounter (Signed)
Wanted to let the patient know that she also had Chlamydia and needs to come in for treatment of Azithromycin 2 g PO x 1 . Will have the [employer] try to contact her.

## 2011-01-23 NOTE — Telephone Encounter (Signed)
Message copied by Jimmy Footman on Tue Jan 23, 2011  3:02 PM ------      Message from: Jamie Brookes      Created: Tue Jan 23, 2011  2:11 PM      Regarding: contact this patient.        This patient has Chlamydia and needs to be treated with Azithro 2 g PO x 1 here in the clinic. I tried to call but could not get through. If you know how to get a hold of her please let her know she needs to come in.       Thanks,      Hospital doctor            ----- Message -----         From: Bonnie Swaziland         Sent: 01/22/2011  11:13 AM           To: Jamie Brookes

## 2011-01-26 ENCOUNTER — Encounter: Payer: Medicaid Other | Admitting: *Deleted

## 2011-01-26 DIAGNOSIS — A749 Chlamydial infection, unspecified: Secondary | ICD-10-CM | POA: Insufficient documentation

## 2011-01-26 NOTE — Assessment & Plan Note (Signed)
Pt is to be treated with 1 g slurry of azithro. (Not 2 g as previously ordered). For a chalmydia infection.

## 2011-01-26 NOTE — Progress Notes (Signed)
This encounter was created in error - please disregard.

## 2011-01-30 ENCOUNTER — Telehealth: Payer: Self-pay | Admitting: Family Medicine

## 2011-01-30 NOTE — Telephone Encounter (Signed)
Gave information to Lyn and she will forward to [facility], due to pt not coming in for Tx.Laureen Ochs, Viann Shove

## 2011-01-30 NOTE — Telephone Encounter (Signed)
Notified Cheryln Manly with [facility] Dept  that we are unable to contact patient. She had appointment 10/28/2010 but did not come in for treatment of Chlamydia. Report and Communicable Disease  form faxed.

## 2011-01-31 ENCOUNTER — Ambulatory Visit: Payer: Medicaid Other

## 2011-02-01 ENCOUNTER — Ambulatory Visit (INDEPENDENT_AMBULATORY_CARE_PROVIDER_SITE_OTHER): Payer: Medicaid Other | Admitting: *Deleted

## 2011-02-01 DIAGNOSIS — A7489 Other chlamydial diseases: Secondary | ICD-10-CM

## 2011-02-01 DIAGNOSIS — A749 Chlamydial infection, unspecified: Secondary | ICD-10-CM

## 2011-02-01 MED ORDER — AZITHROMYCIN 1 G PO PACK
1.0000 g | PACK | Freq: Once | ORAL | Status: AC
  Administered 2011-02-01: 1 g via ORAL

## 2011-02-01 NOTE — Progress Notes (Signed)
Patient in office for STD treatment. Communicable Disease report faxed to [facility] Dept.   Dr. Clotilde Dieter had entered in order for Azithromycin 2 grams  PO on 01/23/2011,  however she verbally gave order today for Azithromycin 1 gram,  which is the usual dose,  to RN today.Leslie Hopkins

## 2011-02-12 ENCOUNTER — Emergency Department (HOSPITAL_COMMUNITY)
Admission: EM | Admit: 2011-02-12 | Discharge: 2011-02-12 | Disposition: A | Discharge status: Home / Self Care | Payer: Medicaid Other | Attending: Emergency Medicine | Admitting: Emergency Medicine

## 2011-02-12 DIAGNOSIS — N12 Tubulo-interstitial nephritis, not specified as acute or chronic: Secondary | ICD-10-CM | POA: Insufficient documentation

## 2011-02-12 DIAGNOSIS — R109 Unspecified abdominal pain: Secondary | ICD-10-CM | POA: Insufficient documentation

## 2011-02-12 DIAGNOSIS — R6883 Chills (without fever): Secondary | ICD-10-CM | POA: Insufficient documentation

## 2011-02-12 LAB — URINALYSIS, ROUTINE W REFLEX MICROSCOPIC
Bilirubin Urine: NEGATIVE
Ketones, ur: NEGATIVE mg/dL
Nitrite: POSITIVE — AB
Protein, ur: 100 mg/dL — AB
Specific Gravity, Urine: 1.013 (ref 1.005–1.030)
Urobilinogen, UA: 1 mg/dL (ref 0.0–1.0)
pH: 7 (ref 5.0–8.0)

## 2011-02-12 LAB — URINE MICROSCOPIC-ADD ON

## 2011-02-13 LAB — URINE CULTURE
Colony Count: >=100000
Culture  Setup Time: 201206110518

## 2011-04-11 ENCOUNTER — Ambulatory Visit: Payer: Medicaid Other | Admitting: Family Medicine

## 2011-05-22 ENCOUNTER — Emergency Department (HOSPITAL_COMMUNITY)
Admission: EM | Admit: 2011-05-22 | Discharge: 2011-05-22 | Discharge status: Against Medical Advice | Payer: Medicaid Other | Attending: Emergency Medicine | Admitting: Emergency Medicine

## 2011-05-22 DIAGNOSIS — H53149 Visual discomfort, unspecified: Secondary | ICD-10-CM | POA: Insufficient documentation

## 2011-05-22 DIAGNOSIS — R51 Headache: Secondary | ICD-10-CM | POA: Insufficient documentation

## 2011-05-22 DIAGNOSIS — R110 Nausea: Secondary | ICD-10-CM | POA: Insufficient documentation

## 2011-05-22 DIAGNOSIS — H538 Other visual disturbances: Secondary | ICD-10-CM | POA: Insufficient documentation

## 2011-05-22 DIAGNOSIS — H5710 Ocular pain, unspecified eye: Secondary | ICD-10-CM | POA: Insufficient documentation

## 2011-11-01 ENCOUNTER — Ambulatory Visit: Payer: Medicaid Other | Admitting: Family Medicine

## 2011-11-02 ENCOUNTER — Encounter: Payer: Self-pay | Admitting: Family Medicine

## 2011-11-02 ENCOUNTER — Ambulatory Visit (INDEPENDENT_AMBULATORY_CARE_PROVIDER_SITE_OTHER): Payer: Medicaid Other | Admitting: Family Medicine

## 2011-11-02 ENCOUNTER — Other Ambulatory Visit: Payer: Self-pay | Admitting: Family Medicine

## 2011-11-02 ENCOUNTER — Other Ambulatory Visit (HOSPITAL_COMMUNITY)
Admission: RE | Admit: 2011-11-02 | Discharge: 2011-11-02 | Disposition: A | Discharge status: Home / Self Care | Payer: Medicaid Other | Source: Ambulatory Visit | Attending: Family Medicine | Admitting: Family Medicine

## 2011-11-02 DIAGNOSIS — B9689 Other specified bacterial agents as the cause of diseases classified elsewhere: Secondary | ICD-10-CM

## 2011-11-02 DIAGNOSIS — N72 Inflammatory disease of cervix uteri: Secondary | ICD-10-CM

## 2011-11-02 DIAGNOSIS — Z113 Encounter for screening for infections with a predominantly sexual mode of transmission: Secondary | ICD-10-CM | POA: Insufficient documentation

## 2011-11-02 DIAGNOSIS — N898 Other specified noninflammatory disorders of vagina: Secondary | ICD-10-CM

## 2011-11-02 LAB — POCT WET PREP (WET MOUNT): Clue Cells Wet Prep Whiff POC: POSITIVE

## 2011-11-02 MED ORDER — METRONIDAZOLE 500 MG PO TABS: 500.0000 mg | ORAL_TABLET | Freq: Two times a day (BID) | ORAL | Status: AC

## 2011-11-02 MED ORDER — CEFTRIAXONE SODIUM 1 G IJ SOLR
250.0000 mg | Freq: Once | INTRAMUSCULAR | Status: AC
  Administered 2011-11-02: 250 mg via INTRAMUSCULAR

## 2011-11-02 MED ORDER — AZITHROMYCIN 1 G PO PACK
1.0000 g | PACK | Freq: Once | ORAL | Status: AC
  Administered 2011-11-02: 1 g via ORAL

## 2011-11-02 NOTE — Progress Notes (Signed)
Addended byArlyss Repress on: 11/02/2011 10:34 AM   Modules accepted: Orders

## 2011-11-02 NOTE — Patient Instructions (Signed)
Bacterial Vaginosis Bacterial vaginosis (BV) is a vaginal infection where the normal balance of bacteria in the vagina is disrupted. The normal balance is then replaced by an overgrowth of certain bacteria. There are several different kinds of bacteria that can cause BV. BV is the most common vaginal infection in women of childbearing age. CAUSES   The cause of BV is not fully understood. BV develops when there is an increase or imbalance of harmful bacteria.   Some activities or behaviors can upset the normal balance of bacteria in the vagina and put women at increased risk including:   Having a new sex partner or multiple sex partners.   Douching.   Using an intrauterine device (IUD) for contraception.   It is not clear what role sexual activity plays in the development of BV. However, women that have never had sexual intercourse are rarely infected with BV.  Women do not get BV from toilet seats, bedding, swimming pools or from touching objects around them.  SYMPTOMS   Grey vaginal discharge.   A fish-like odor with discharge, especially after sexual intercourse.   Itching or burning of the vagina and vulva.   Burning or pain with urination.   Some women have no signs or symptoms at all.  DIAGNOSIS  Your caregiver must examine the vagina for signs of BV. Your caregiver will perform lab tests and look at the sample of vaginal fluid through a microscope. They will look for bacteria and abnormal cells (clue cells), a pH test higher than 4.5, and a positive amine test all associated with BV.  RISKS AND COMPLICATIONS   Pelvic inflammatory disease (PID).   Infections following gynecology surgery.   Developing HIV.   Developing herpes virus.  TREATMENT  Sometimes BV will clear up without treatment. However, all women with symptoms of BV should be treated to avoid complications, especially if gynecology surgery is planned. Female partners generally do not need to be treated. However,  BV may spread between female sex partners so treatment is helpful in preventing a recurrence of BV.   BV may be treated with antibiotics. The antibiotics come in either pill or vaginal cream forms. Either can be used with nonpregnant or pregnant women, but the recommended dosages differ. These antibiotics are not harmful to the baby.   BV can recur after treatment. If this happens, a second round of antibiotics will often be prescribed.   Treatment is important for pregnant women. If not treated, BV can cause a premature delivery, especially for a pregnant woman who had a premature birth in the past. All pregnant women who have symptoms of BV should be checked and treated.   For chronic reoccurrence of BV, treatment with a type of prescribed gel vaginally twice a week is helpful.  HOME CARE INSTRUCTIONS   Finish all medication as directed by your caregiver.   Do not have sex until treatment is completed.   Tell your sexual partner that you have a vaginal infection. They should see their caregiver and be treated if they have problems, such as a mild rash or itching.   Practice safe sex. Use condoms. Only have 1 sex partner.  PREVENTION  Basic prevention steps can help reduce the risk of upsetting the natural balance of bacteria in the vagina and developing BV:  Do not have sexual intercourse (be abstinent).   Do not douche.   Use all of the medicine prescribed for treatment of BV, even if the signs and symptoms go away.     Tell your sex partner if you have BV. That way, they can be treated, if needed, to prevent reoccurrence.  SEEK MEDICAL CARE IF:   Your symptoms are not improving after 3 days of treatment.   You have increased discharge, pain, or fever.  MAKE SURE YOU:   Understand these instructions.   Will watch your condition.   Will get help right away if you are not doing well or get worse.  FOR MORE INFORMATION  Division of STD Prevention (DSTDP), Centers for Disease  Control and Prevention: www.cdc.gov/std American Social Health Association (ASHA): www.ashastd.org  Document Released: 08/20/2005 Document Revised: 05/02/2011 Document Reviewed: 02/10/2009 ExitCare Patient Information 2012 [employer]. 

## 2011-11-02 NOTE — Progress Notes (Signed)
  Subjective:    Patient ID: Leslie Hopkins, female    DOB: 09-15-1990, 20 y.o.   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     Objective:   Physical Exam Gen: up in chair, NAD ABD: S/NT/+ bowel sounds  GYN: normal external genitalia, mild vaginal discharge, no CMT          Assessment & Plan:

## 2011-11-02 NOTE — Assessment & Plan Note (Signed)
Rocephin 250 and azithromycin 1gm. Wet prep, GC/Chl. Will rx meds if BV, yeast indicative. Pt expressed understanding. Handout given .

## 2012-02-04 ENCOUNTER — Emergency Department (HOSPITAL_COMMUNITY): Payer: Self-pay

## 2012-02-04 ENCOUNTER — Encounter (HOSPITAL_COMMUNITY): Payer: Self-pay

## 2012-02-04 ENCOUNTER — Emergency Department (HOSPITAL_COMMUNITY)
Admission: EM | Admit: 2012-02-04 | Discharge: 2012-02-04 | Disposition: A | Discharge status: Home / Self Care | Payer: Self-pay | Attending: Emergency Medicine | Admitting: Emergency Medicine

## 2012-02-04 DIAGNOSIS — S93409A Sprain of unspecified ligament of unspecified ankle, initial encounter: Secondary | ICD-10-CM | POA: Insufficient documentation

## 2012-02-04 DIAGNOSIS — X58XXXA Exposure to other specified factors, initial encounter: Secondary | ICD-10-CM | POA: Insufficient documentation

## 2012-02-04 MED ORDER — TRAMADOL HCL 50 MG PO TABS: 50.0000 mg | ORAL_TABLET | Freq: Four times a day (QID) | ORAL | Status: AC | PRN

## 2012-02-04 MED ORDER — IBUPROFEN 600 MG PO TABS: 600.0000 mg | ORAL_TABLET | Freq: Four times a day (QID) | ORAL | Status: AC | PRN

## 2012-02-04 NOTE — Discharge Instructions (Signed)
Ankle Sprain An ankle sprain is an injury to the strong, fibrous tissues (ligaments) that hold the bones of your ankle joint together.  CAUSES Ankle sprain usually is caused by a fall or by twisting your ankle. People who participate in sports are more prone to these types of injuries.  SYMPTOMS  Symptoms of ankle sprain include:  Pain in your ankle. The pain may be present at rest or only when you are trying to stand or walk.   Swelling.   Bruising. Bruising may develop immediately or within 1 to 2 days after your injury.   Difficulty standing or walking.  DIAGNOSIS  Your caregiver will ask you details about your injury and perform a physical exam of your ankle to determine if you have an ankle sprain. During the physical exam, your caregiver will press and squeeze specific areas of your foot and ankle. Your caregiver will try to move your ankle in certain ways. An X-ray exam may be done to be sure a bone was not broken or a ligament did not separate from one of the bones in your ankle (avulsion).  TREATMENT  Certain types of braces can help stabilize your ankle. Your caregiver can make a recommendation for this. Your caregiver may recommend the use of medication for pain. If your sprain is severe, your caregiver may refer you to a surgeon who helps to restore function to parts of your skeletal system (orthopedist) or a physical therapist. HOME CARE INSTRUCTIONS  Apply ice to your injury for 1 to 2 days or as directed by your caregiver. Applying ice helps to reduce inflammation and pain.  Put ice in a plastic bag.   Place a towel between your skin and the bag.   Leave the ice on for 15 to 20 minutes at a time, every 2 hours while you are awake.   Take over-the-counter or prescription medicines for pain, discomfort, or fever only as directed by your caregiver.   Keep your injured leg elevated, when possible, to lessen swelling.   If your caregiver recommends crutches, use them as  instructed. Gradually, put weight on the affected ankle. Continue to use crutches or a cane until you can walk without feeling pain in your ankle.   If you have a plaster splint, wear the splint as directed by your caregiver. Do not rest it on anything harder than a pillow the first 24 hours. Do not put weight on it. Do not get it wet. You may take it off to take a shower or bath.   You may have been given an elastic bandage to wear around your ankle to provide support. If the elastic bandage is too tight (you have numbness or tingling in your foot or your foot becomes cold and blue), adjust the bandage to make it comfortable.   If you have an air splint, you may blow more air into it or let air out to make it more comfortable. You may take your splint off at night and before taking a shower or bath.   Wiggle your toes in the splint several times per day if you are able.  SEEK MEDICAL CARE IF:   You have an increase in bruising, swelling, or pain.   Your toes feel cold.   Pain relief is not achieved with medication.  SEEK IMMEDIATE MEDICAL CARE IF: Your toes are numb or blue or you have severe pain. MAKE SURE YOU:   Understand these instructions.   Will watch your condition.     Will get help right away if you are not doing well or get worse.  Document Released: 08/20/2005 Document Revised: 08/09/2011 Document Reviewed: 03/24/2008 ExitCare Patient Information 2012 [employer]. 

## 2012-02-04 NOTE — ED Provider Notes (Signed)
History  This chart was scribed for Leslie Racer, MD by Leslie Hopkins. This patient was seen in room CDU10/CDU10 and the patient's care was started at 2:13PM.  CSN: [id number]  Arrival date & time 02/04/12  1354   First MD Initiated Contact with Patient 02/04/12 1413      Chief Complaint  Patient presents with  . Ankle Injury    Patient is a 21 y.o. female presenting with lower extremity injury. The history is provided by the patient. No language interpreter was used.  Ankle Injury This is a new problem. The current episode started more than 2 days ago. The problem occurs constantly. The problem has been gradually worsening. Pertinent negatives include no chest pain, no abdominal pain, no headaches and no shortness of breath. The symptoms are aggravated by walking. The symptoms are relieved by rest. She has tried a cold compress for the symptoms. The treatment provided mild relief.    Leslie Hopkins is a 21 y.o. female with no h/o chronic medical conditions who presents to the Emergency Department complaining of one week of gradual onset, gradually worsening, constant right foot pain located in the middle of the foot. The pain has been worse over the past 3 days. Pt states that she has recently started exercising and thinks that she may have sprained her ankle. She reports using ice on the area with mild improvement in the pain. She denies using OTC medications to improve the pain PTA. She has not seen her PCP for the symptoms. She denies any other associated symptoms currently. She denies smoking.   History reviewed. No pertinent past medical history.  History reviewed. No pertinent past surgical history.  History reviewed. No pertinent family history.  History  Substance Use Topics  . Smoking status: Never Smoker   . Smokeless tobacco: Not on file  . Alcohol Use: Not on file    Review of Systems  Constitutional: Negative for fever and chills.  Respiratory: Negative for  shortness of breath.   Cardiovascular: Negative for chest pain.  Gastrointestinal: Negative for nausea, vomiting and abdominal pain.  Musculoskeletal: Negative for back pain.       Right ankle pain  Neurological: Negative for weakness and headaches.    Allergies  Review of patient's allergies indicates no known allergies.  Home Medications   Current Outpatient Rx  Name Route Sig Dispense Refill  . IBUPROFEN 600 MG PO TABS Oral Take 1 tablet (600 mg total) by mouth every 6 (six) hours as needed for pain. 30 tablet 0  . TRAMADOL HCL 50 MG PO TABS Oral Take 1 tablet (50 mg total) by mouth every 6 (six) hours as needed for pain. 15 tablet 0    Triage Vitals: BP 122/72  Pulse 90  Temp(Src) 98.8 F (37.1 C) (Oral)  Resp 17  SpO2 100%  LMP 01/04/2012  Physical Exam  Nursing note and vitals reviewed. Constitutional: She is oriented to person, place, and time. She appears well-developed and well-nourished. No distress.  HENT:  Head: Normocephalic and atraumatic.  Eyes: EOM are normal.  Neck: Neck supple. No tracheal deviation present.  Cardiovascular: Normal rate.   Pulmonary/Chest: Effort normal. No respiratory distress.  Musculoskeletal: Normal range of motion. She exhibits tenderness.       Mild tenderness along the distal and inferior aspects of the right lateral malleolus, no obvious sign of injury, 2+ DP, achilles intact  Neurological: She is alert and oriented to person, place, and time.  Skin: Skin is warm  and dry.  Psychiatric: She has a normal mood and affect. Her behavior is normal.    ED Course  Procedures (including critical care time)  DIAGNOSTIC STUDIES: Oxygen Saturation is 100% on room air, normal by my interpretation.    COORDINATION OF CARE: 2:27PM-Discussed treatment plan of x-rays with pt and pt agreed to plan. 3:22PM-Informed pt of radiology report. Discussed discharge plan with pt and pt agreed to plan.   Labs Reviewed - No data to display Dg Ankle  2 Views Right  02/04/2012  *RADIOLOGY REPORT*  Clinical Data: Trauma last week.  Worsening pain.  Lateral pain.  RIGHT ANKLE - 2 VIEW  Comparison: None.  Findings: No acute fracture or dislocation.  Talar dome and base of fifth metatarsal intact.  No ankle joint effusion.  IMPRESSION: No acute osseous abnormality.  Original Report Authenticated By: Leslie Hopkins, M.D.     1. Ankle sprain       MDM  I personally performed the services described in this documentation, which was scribed in my presence. The recorded information has been reviewed and considered.      Leslie Racer, MD 02/04/12 1924

## 2012-02-04 NOTE — ED Notes (Signed)
Right ankle pain, sts has been working out more and may have injured it.

## 2012-02-04 NOTE — ED Notes (Addendum)
Pt denies any injury to right ankle, states "it just came about a week and a half ago." Pt states pain inc remarkably this weekend. Pt denies any swelling. States "it just hurts" No deformity or swelling noted to foot.

## 2012-10-30 ENCOUNTER — Encounter: Payer: Self-pay | Admitting: Family Medicine

## 2012-10-30 ENCOUNTER — Ambulatory Visit (INDEPENDENT_AMBULATORY_CARE_PROVIDER_SITE_OTHER): Payer: BC Managed Care – PPO | Admitting: Family Medicine

## 2012-10-30 ENCOUNTER — Other Ambulatory Visit (HOSPITAL_COMMUNITY)
Admission: RE | Admit: 2012-10-30 | Discharge: 2012-10-30 | Disposition: A | Discharge status: Home / Self Care | Payer: BC Managed Care – PPO | Source: Ambulatory Visit | Attending: Family Medicine | Admitting: Family Medicine

## 2012-10-30 VITALS — BP 110/71 | HR 98 | Temp 98.4°F | Ht 62.0 in | Wt 165.0 lb

## 2012-10-30 DIAGNOSIS — Z113 Encounter for screening for infections with a predominantly sexual mode of transmission: Secondary | ICD-10-CM | POA: Insufficient documentation

## 2012-10-30 DIAGNOSIS — N898 Other specified noninflammatory disorders of vagina: Secondary | ICD-10-CM

## 2012-10-30 LAB — POCT WET PREP (WET MOUNT)

## 2012-10-30 MED ORDER — FLUCONAZOLE 150 MG PO TABS: 150.0000 mg | ORAL_TABLET | Freq: Once | ORAL | Status: DC

## 2012-10-30 MED ORDER — METRONIDAZOLE 500 MG PO TABS: 500.0000 mg | ORAL_TABLET | Freq: Two times a day (BID) | ORAL | Status: DC

## 2012-10-30 NOTE — Progress Notes (Signed)
  Subjective:    Patient ID: Leslie Hopkins, female    DOB: 1991/04/03, 22 y.o.   MRN: [id number]  HPI 21 yo here for evaluation of vaginal discharge  History of 1 day.  Notes to be white, yellow, with some itching.  No dysuria, abdominal pain, or fever.  Reports monogamous partner and normal STD check at [facility] one month ago.  At that time got antibiotics for BV, yeast, and a UTI.   Review of Systems    see HPI Objective:   Physical Exam GEN: Alert & Oriented, No acute distress Pelvic Exam:        External: normal female genitalia without lesions or masses        Vagina: normal without lesions or masses        Cervix: normal without lesions or masses        Adnexa: normal bimanual exam without masses or fullness        Uterus: normal by palpation        Samples for Wet prep, GC/Chlamydia obtained         Assessment & Plan:

## 2012-10-30 NOTE — Assessment & Plan Note (Signed)
Evidence of yeast and BV on wet prep.  Will update her on GC/Chlamydia as it returns.  Metrionidazole + diflucan

## 2012-10-30 NOTE — Patient Instructions (Addendum)
Will call if you have any infections that need to be treated.  Will send letter if all is negative.  If you dont get a letter in 1-2 weeks, please call for results  Use condoms always

## 2012-11-06 ENCOUNTER — Encounter: Payer: Self-pay | Admitting: Family Medicine

## 2013-06-09 ENCOUNTER — Ambulatory Visit: Payer: Self-pay

## 2013-09-16 ENCOUNTER — Emergency Department (HOSPITAL_COMMUNITY)
Admission: EM | Admit: 2013-09-16 | Discharge: 2013-09-16 | Disposition: A | Discharge status: Home / Self Care | Payer: Self-pay | Attending: Emergency Medicine | Admitting: Emergency Medicine

## 2013-09-16 ENCOUNTER — Encounter (HOSPITAL_COMMUNITY): Payer: Self-pay | Admitting: Emergency Medicine

## 2013-09-16 ENCOUNTER — Emergency Department (HOSPITAL_COMMUNITY): Payer: Self-pay

## 2013-09-16 DIAGNOSIS — R509 Fever, unspecified: Secondary | ICD-10-CM | POA: Insufficient documentation

## 2013-09-16 DIAGNOSIS — R059 Cough, unspecified: Secondary | ICD-10-CM | POA: Insufficient documentation

## 2013-09-16 DIAGNOSIS — J069 Acute upper respiratory infection, unspecified: Secondary | ICD-10-CM | POA: Insufficient documentation

## 2013-09-16 DIAGNOSIS — R05 Cough: Secondary | ICD-10-CM | POA: Insufficient documentation

## 2013-09-16 DIAGNOSIS — J3489 Other specified disorders of nose and nasal sinuses: Secondary | ICD-10-CM | POA: Insufficient documentation

## 2013-09-16 MED ORDER — SODIUM CHLORIDE 0.9 % IV BOLUS (SEPSIS)
1000.0000 mL | Freq: Once | INTRAVENOUS | Status: AC
  Administered 2013-09-16: 1000 mL via INTRAVENOUS

## 2013-09-16 MED ORDER — ONDANSETRON HCL 4 MG/2ML IJ SOLN
4.0000 mg | Freq: Once | INTRAMUSCULAR | Status: AC
  Administered 2013-09-16: 4 mg via INTRAVENOUS
  Filled 2013-09-16: qty 2

## 2013-09-16 MED ORDER — ACETAMINOPHEN 325 MG PO TABS
650.0000 mg | ORAL_TABLET | Freq: Once | ORAL | Status: AC
  Administered 2013-09-16: 650 mg via ORAL
  Filled 2013-09-16: qty 2

## 2013-09-16 NOTE — ED Notes (Signed)
MD at bedside. 

## 2013-09-16 NOTE — ED Notes (Signed)
Pt reports cough, chills, fever, nausea x 2 days. Worse last night.

## 2013-09-16 NOTE — ED Provider Notes (Signed)
Medical screening examination/treatment/procedure(s) were conducted as a shared visit with non-physician practitioner(s) and myself.  I personally evaluated the patient during the encounter. Patient is a [age] year old female presents emergency department with complaints of cough, muscle aches, and congestion for the past several days. She denies shortness of breath or chest pain.  On exam, vitals are stable the patient is afebrile. Head is atraumatic, normocephalic. Neck is supple. Heart is regular rate and rhythm and lungs are clear. Oropharynx is clear.  Symptoms seem viral in nature. We'll recommend over-the-counter medications and when necessary followup if symptoms worsen or change.     Geoffery Lyonsouglas Alen Matheson, MD 09/16/13 [phone]

## 2013-09-16 NOTE — Discharge Instructions (Signed)
Antibiotic Nonuse   Your caregiver felt that the infection or problem was not one that would be helped with an antibiotic.  Infections may be caused by viruses or bacteria. Only a caregiver can tell which one of these is the likely cause of an illness. A cold is the most common cause of infection in both adults and children. A cold is a virus. Antibiotic treatment will have no effect on a viral infection. Viruses can lead to many lost days of work caring for sick children and many missed days of school. Children may catch as many as 10 "colds" or "flus" per year during which they can be tearful, cranky, and uncomfortable. The goal of treating a virus is aimed at keeping the ill person comfortable.  Antibiotics are medications used to help the body fight bacterial infections. There are relatively few types of bacteria that cause infections but there are hundreds of viruses. While both viruses and bacteria cause infection they are very different types of germs. A viral infection will typically go away by itself within 7 to 10 days. Bacterial infections may spread or get worse without antibiotic treatment.  Examples of bacterial infections are:   Sore throats (like strep throat or tonsillitis).   Infection in the lung (pneumonia).   Ear and skin infections.  Examples of viral infections are:   Colds or flus.   Most coughs and bronchitis.   Sore throats not caused by Strep.   Runny noses.  It is often best not to take an antibiotic when a viral infection is the cause of the problem. Antibiotics can kill off the helpful bacteria that we have inside our body and allow harmful bacteria to start growing. Antibiotics can cause side effects such as allergies, nausea, and diarrhea without helping to improve the symptoms of the viral infection. Additionally, repeated uses of antibiotics can cause bacteria inside of our body to become resistant. That resistance can be passed onto harmful bacterial. The next time you have  an infection it may be harder to treat if antibiotics are used when they are not needed. Not treating with antibiotics allows our own immune system to develop and take care of infections more efficiently. Also, antibiotics will work better for us when they are prescribed for bacterial infections.  Treatments for a child that is ill may include:   Give extra fluids throughout the day to stay hydrated.   Get plenty of rest.   Only give your child over-the-counter or prescription medicines for pain, discomfort, or fever as directed by your caregiver.   The use of a cool mist humidifier may help stuffy noses.   Cold medications if suggested by your caregiver.  Your caregiver may decide to start you on an antibiotic if:   The problem you were seen for today continues for a longer length of time than expected.   You develop a secondary bacterial infection.  SEEK MEDICAL CARE IF:   Fever lasts longer than 5 days.   Symptoms continue to get worse after 5 to 7 days or become severe.   Difficulty in breathing develops.   Signs of dehydration develop (poor drinking, rare urinating, dark colored urine).   Changes in behavior or worsening tiredness (listlessness or lethargy).  Document Released: 10/29/2001 Document Revised: 11/12/2011 Document Reviewed: 04/27/2009  ExitCare Patient Information 2014 [employer].

## 2013-09-16 NOTE — ED Provider Notes (Signed)
CSN: [id number]     Arrival date & time 09/16/13  1242 History   First MD Initiated Contact with Patient 09/16/13 1557     Chief Complaint  Patient presents with  . Emesis  . Cough   (Consider location/radiation/quality/duration/timing/severity/associated sxs/prior Treatment) Patient is a 23 y.o. female presenting with vomiting, cough, and URI.  Emesis Associated symptoms: myalgias and URI   Associated symptoms: no abdominal pain, no arthralgias, no chills, no diarrhea, no headaches and no sore throat   Cough Associated symptoms: fever, myalgias and rhinorrhea   Associated symptoms: no chest pain, no chills, no headaches, no rash, no shortness of breath, no sore throat and no wheezing   URI Presenting symptoms: congestion, cough, fever and rhinorrhea   Presenting symptoms: no sore throat   Severity:  Moderate Onset quality:  Gradual Duration:  3 days Timing:  Constant Progression:  Worsening Chronicity:  New Relieved by:  OTC medications Associated symptoms: myalgias   Associated symptoms: no arthralgias, no headaches, no neck pain, no sneezing and no wheezing   Risk factors: no sick contacts (works at call center, 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 No pertinent past medical history. History reviewed. No pertinent past surgical history. No family history on file. History  Substance Use Topics  . Smoking status: Never Smoker   . Smokeless tobacco: Not on file  . Alcohol Use: No   OB History   Grav Para Term Preterm Abortions TAB SAB Ect Mult Living                 Review of Systems  Constitutional: Positive for fever. Negative for chills.  HENT: Positive for congestion and rhinorrhea. Negative for sneezing and sore throat.   Eyes: Negative for pain.  Respiratory: Positive for cough. Negative for shortness of breath and wheezing.   Cardiovascular: Negative for chest pain.  Gastrointestinal: Positive for vomiting. Negative for nausea,  abdominal pain and diarrhea.  Genitourinary: Negative for dysuria.  Musculoskeletal: Positive for myalgias. Negative for arthralgias, back pain and neck pain.  Skin: Negative for rash.  Neurological: Negative for numbness and headaches.    Allergies  Review of patient's allergies indicates no known allergies.  Home Medications   Current Outpatient Rx  Name  Route  Sig  Dispense  Refill  . DM-Doxylamine-Acetaminophen (NYQUIL COLD & FLU PO)   Oral   Take 30 mLs by mouth daily as needed (for cold).          BP 112/67  Pulse 119  Temp(Src) 102.3 F (39.1 C) (Oral)  Resp 18  SpO2 98%  LMP 08/31/2013 Physical Exam  Constitutional: She is oriented to person, place, and time. She appears well-developed and well-nourished. She appears ill. No distress.  HENT:  Head: Normocephalic and atraumatic.  Nose: Mucosal edema and rhinorrhea present.  Mouth/Throat: Posterior oropharyngeal erythema present. No posterior oropharyngeal edema.  Eyes: Pupils are equal, round, and reactive to light. Right eye exhibits no discharge. Left eye exhibits no discharge.  Neck: Normal range of motion.  Cardiovascular: Normal rate, regular rhythm and normal heart sounds.   Pulmonary/Chest: Effort normal and breath sounds normal.  Abdominal: Soft. She exhibits no distension. There is no tenderness.  Musculoskeletal: Normal range of motion.  Neurological: She is alert and oriented to person, place, and time.  Skin: Skin is warm. She is not diaphoretic.    ED Course  Procedures (including critical care time) Labs Review Labs Reviewed - No data to  display Imaging Review Dg Chest 2 View  09/16/2013   CLINICAL DATA:  Fever, cough  EXAM: CHEST  2 VIEW  COMPARISON:  None.  FINDINGS: Cardiomediastinal silhouette is unremarkable. No acute infiltrate or pleural effusion. No pulmonary edema. Bony thorax is unremarkable.  IMPRESSION: No active cardiopulmonary disease.   Electronically Signed   By: Natasha MeadLiviu  Pop M.D.    On: 09/16/2013 13:38    EKG Interpretation   None       MDM   1. Viral URI     23 yo F with no known PMHx who presents with nausea, vomiting, cough, chills, fever x3 days, c/w viral URI.   Signs/sxs c/w URI. Patient with fever, tachycardia upon arrival. Will treat with tylenol, IVF, recheck. If symptomatic improvement, will discharge.   Patient with mild improvement with fluids, antiemetics. Patient appears stable for discharge. Discussed return precautions, and discussed likely course of illness. Patient voices understanding, and feels comfortable with discharge. Discharged to home in stable condition. Patient seen and evaluated by myself and my attending, Dr. Judd Lienelo.        Imagene ShellerSteve Romello Hoehn, MD 09/16/13 Ebony [id number]

## 2014-10-10 IMAGING — CR DG CHEST 2V
2 series · 2 of 2 positions shown · non-contrast
Comparison: None.

CLINICAL DATA: Fever, cough

EXAM:
CHEST  2 VIEW

[w chest pa]
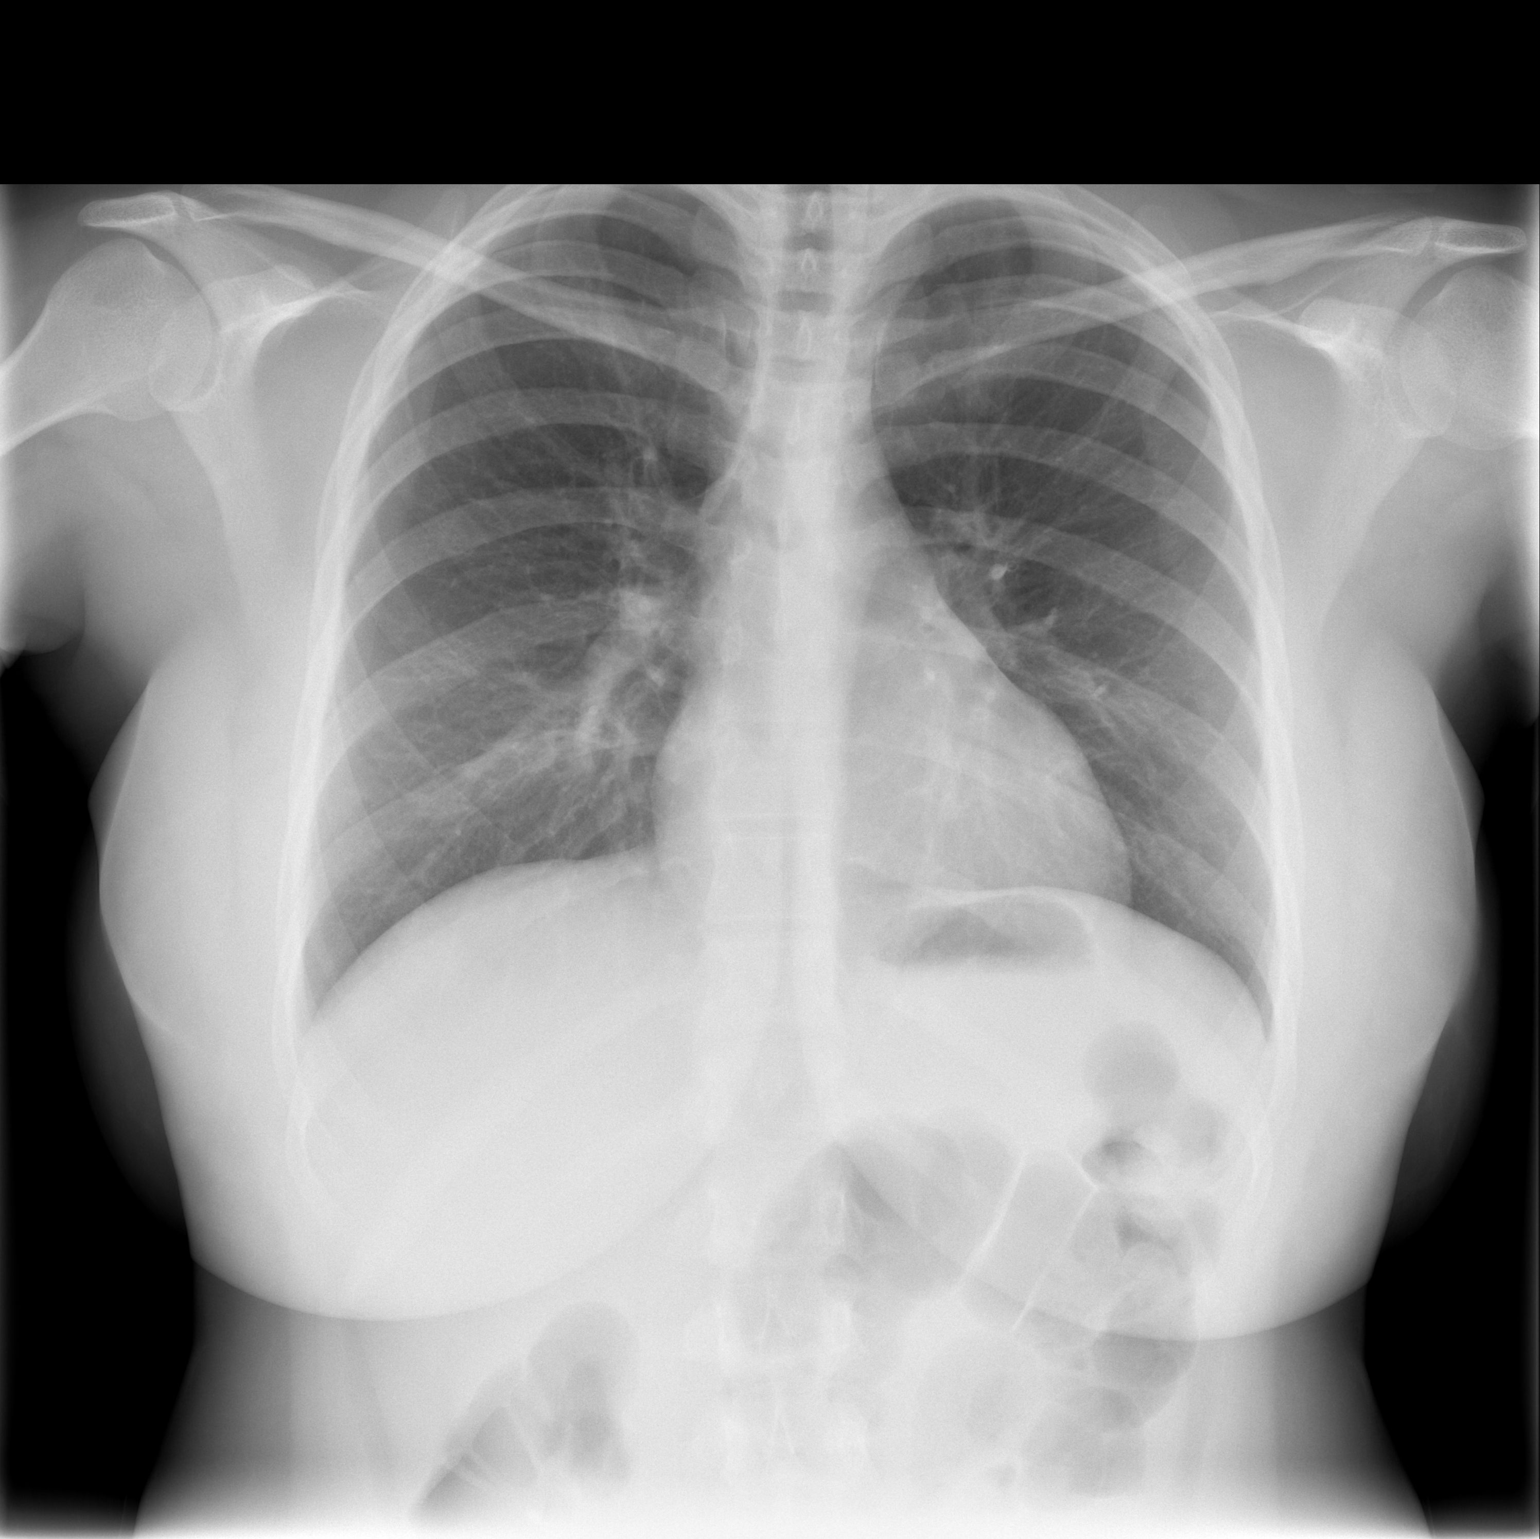

[w chest lat]
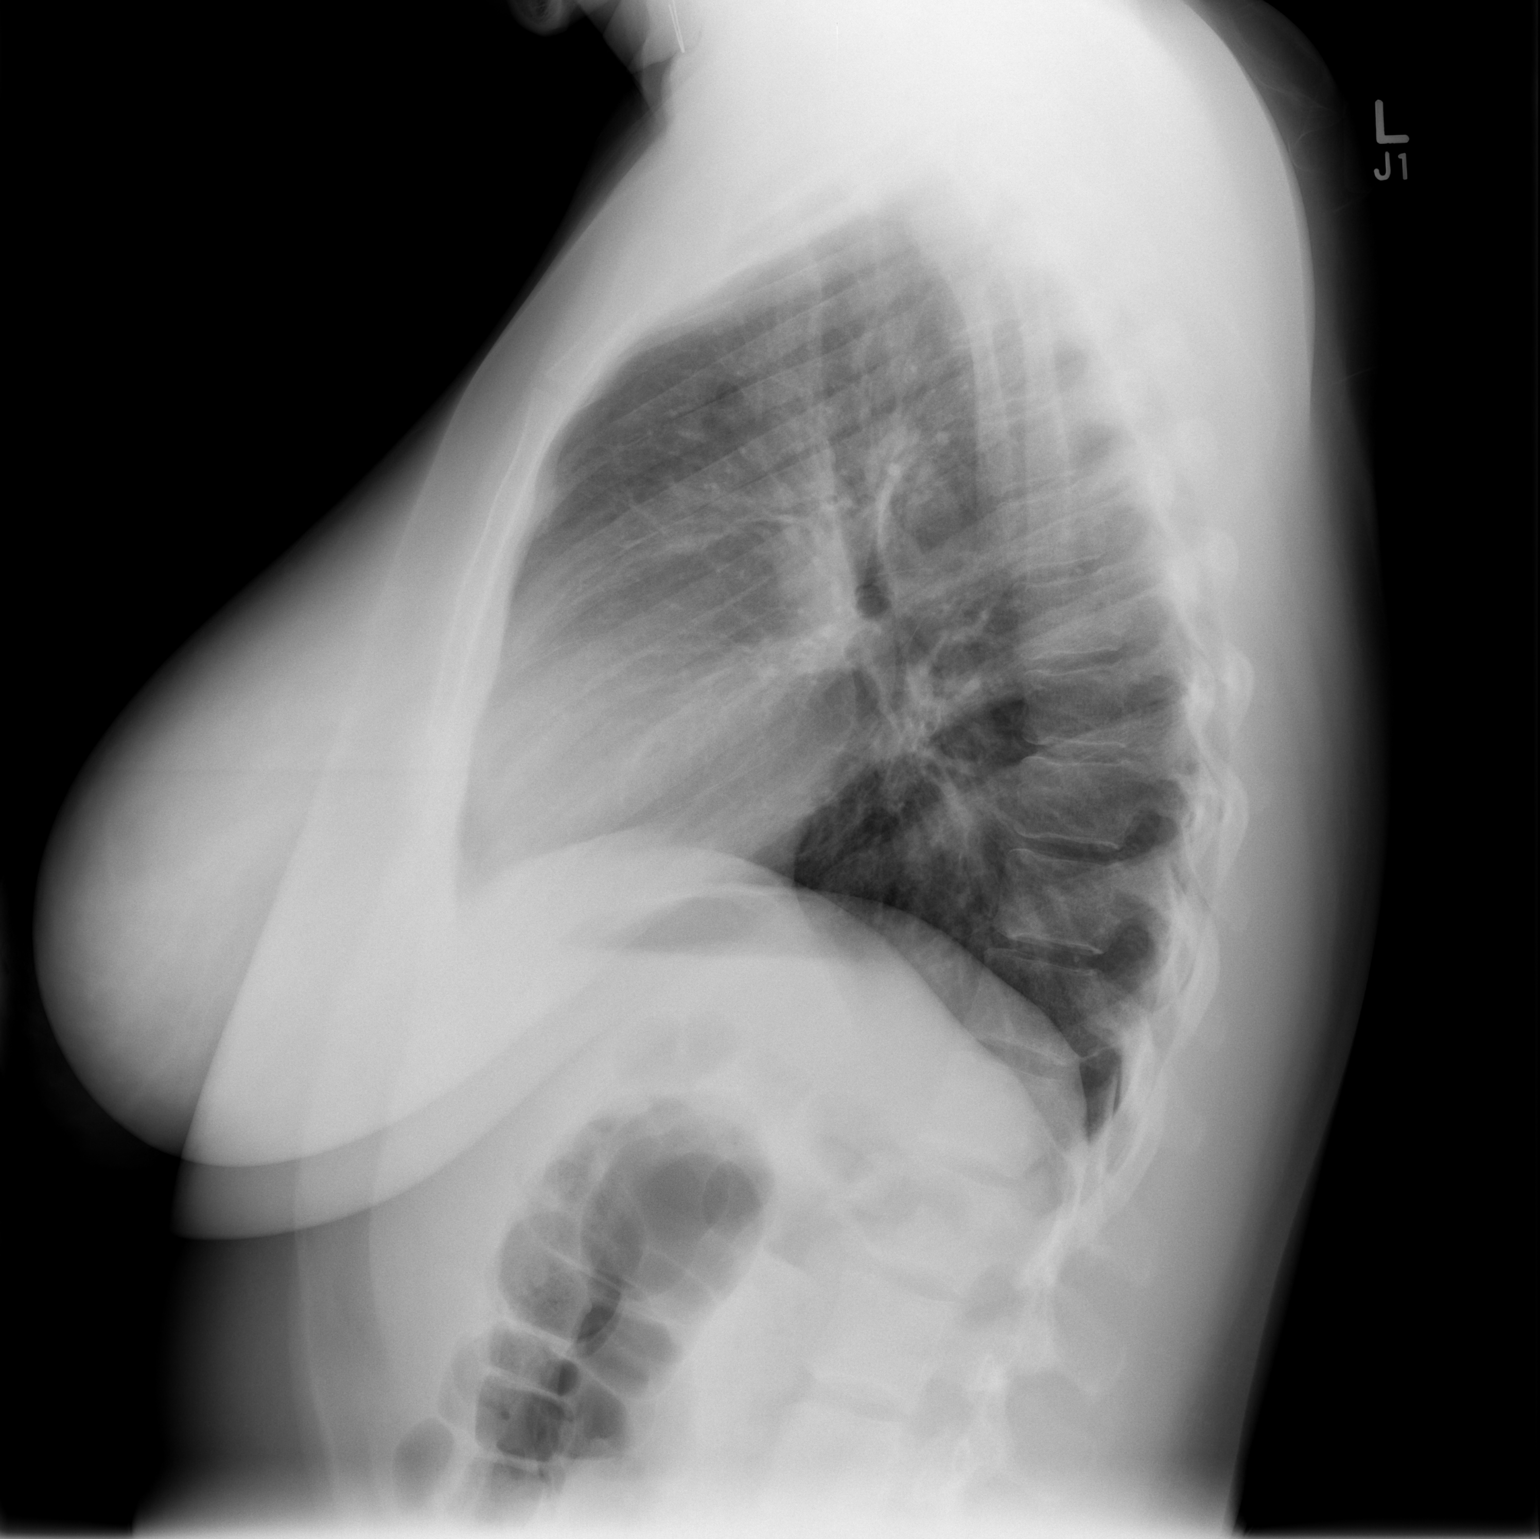

[2 of 2 positions shown; findings below may reference images not displayed]

FINDINGS: Cardiomediastinal silhouette is unremarkable. No acute infiltrate or
pleural effusion. No pulmonary edema. Bony thorax is unremarkable.
IMPRESSION: No active cardiopulmonary disease.

## 2016-12-23 ENCOUNTER — Encounter (HOSPITAL_COMMUNITY): Payer: Self-pay | Admitting: Emergency Medicine

## 2016-12-23 ENCOUNTER — Emergency Department (HOSPITAL_COMMUNITY)
Admission: EM | Admit: 2016-12-23 | Discharge: 2016-12-23 | Disposition: A | Discharge status: Home / Self Care | Payer: 59 | Attending: Emergency Medicine | Admitting: Emergency Medicine

## 2016-12-23 DIAGNOSIS — R197 Diarrhea, unspecified: Secondary | ICD-10-CM | POA: Diagnosis not present

## 2016-12-23 DIAGNOSIS — N390 Urinary tract infection, site not specified: Secondary | ICD-10-CM | POA: Insufficient documentation

## 2016-12-23 LAB — COMPREHENSIVE METABOLIC PANEL
ALT: 11 U/L — ABNORMAL LOW (ref 14–54)
AST: 18 U/L (ref 15–41)
Albumin: 3.6 g/dL (ref 3.5–5.0)
Alkaline Phosphatase: 57 U/L (ref 38–126)
Anion gap: 7 (ref 5–15)
BUN: 6 mg/dL (ref 6–20)
CHLORIDE: 107 mmol/L (ref 101–111)
CO2: 23 mmol/L (ref 22–32)
Calcium: 8.9 mg/dL (ref 8.9–10.3)
Creatinine, Ser: 0.83 mg/dL (ref 0.44–1.00)
GFR calc Af Amer: >60 mL/min (ref >60)
GFR calc non Af Amer: >60 mL/min (ref >60)
GLUCOSE: 102 mg/dL — AB (ref 65–99)
POTASSIUM: 3.8 mmol/L (ref 3.5–5.1)
Sodium: 137 mmol/L (ref 135–145)
Total Bilirubin: 0.4 mg/dL (ref 0.3–1.2)
Total Protein: 7.2 g/dL (ref 6.5–8.1)

## 2016-12-23 LAB — URINALYSIS, ROUTINE W REFLEX MICROSCOPIC
Bilirubin Urine: NEGATIVE
Glucose, UA: NEGATIVE mg/dL
Ketones, ur: NEGATIVE mg/dL
Nitrite: POSITIVE — AB
PROTEIN: NEGATIVE mg/dL
SPECIFIC GRAVITY, URINE: 1.016 (ref 1.005–1.030)
pH: 6 (ref 5.0–8.0)

## 2016-12-23 LAB — I-STAT BETA HCG BLOOD, ED (MC, WL, AP ONLY)

## 2016-12-23 LAB — LIPASE, BLOOD: Lipase: 23 U/L (ref 11–51)

## 2016-12-23 LAB — CBC
HEMATOCRIT: 38.9 % (ref 36.0–46.0)
Hemoglobin: 12.4 g/dL (ref 12.0–15.0)
MCH: 26.6 pg (ref 26.0–34.0)
MCHC: 31.9 g/dL (ref 30.0–36.0)
MCV: 83.3 fL (ref 78.0–100.0)
Platelets: 178 10*3/uL (ref 150–400)
RBC: 4.67 MIL/uL (ref 3.87–5.11)
RDW: 12.8 % (ref 11.5–15.5)
WBC: 6.3 10*3/uL (ref 4.0–10.5)

## 2016-12-23 MED ORDER — CIPROFLOXACIN HCL 500 MG PO TABS: 500.0000 mg | ORAL_TABLET | Freq: Two times a day (BID) | ORAL | 0 refills | Status: AC

## 2016-12-23 MED ORDER — ONDANSETRON HCL 4 MG PO TABS: 4.0000 mg | ORAL_TABLET | Freq: Three times a day (TID) | ORAL | 0 refills | Status: AC | PRN

## 2016-12-23 NOTE — ED Triage Notes (Signed)
Patient states that she has had diarrhea since around 4/8 while she was in [place] and continued since been home with some vomiting.

## 2016-12-23 NOTE — Discharge Instructions (Signed)
Please read instructions below. Take the antibiotic, ciprofloxacin, 2 times per day until they are gone. You can take the zofran as needed for nausea. To establish care with a local primary care provider, call the phone number on the back of your insurance card. Return to the ER for fever or new or worsening symptoms.

## 2016-12-23 NOTE — ED Provider Notes (Signed)
[facility]-EMERGENCY DEPT Provider Note   CSN: [id number] Arrival date & time: 12/23/16  1057     History   Chief Complaint Chief Complaint  Patient presents with  . Diarrhea    HPI Leslie Hopkins is a 26 y.o. female.  Pt presents w diarrhea since 12/09/16. Diarrhea occurs 1-2/day since her recent travel to the [place]. Diarrhea is watery,  non-bloody and without pus. Assoc w intermittent nausea and diffuse abdominal cramping. Two days ago, pt experienced worsening diarrhea and vomiting that lasted one day. Since that day, her diarrhea has been at most 2 times per day. Also reports an abnormal odor to her urine. No tenesmus, vaginal bleeding, discharge, dysuria, inc urinary freq, flank pain.       History reviewed. No pertinent past medical history.  Patient Active Problem List   Diagnosis Date Noted  . Chlamydia 01/26/2011  . Back pain 01/22/2011  . Bacterial vaginosis 01/22/2011  . Yeast infection of the vagina 01/22/2011  . Vaginal discharge 11/06/2010  . Contraception management 11/06/2010  . VAGINAL DISCHARGE 08/30/2009  . ECZEMA, ATOPIC DERMATITIS 10/31/2006  . ACNE 10/31/2006    History reviewed. No pertinent surgical history.  OB History    No data available       Home Medications    Prior to Admission medications   Medication Sig Start Date End Date Taking? Authorizing Provider  NUVARING 0.12-0.015 MG/24HR vaginal ring Place 1 Device vaginally every 28 (twenty-eight) days. 09/21/16  Yes Historical Provider, MD  ciprofloxacin (CIPRO) 500 MG tablet Take 1 tablet (500 mg total) by mouth every 12 (twelve) hours. 12/23/16 12/28/16  [place] N Russo, PA-C  ondansetron (ZOFRAN) 4 MG tablet Take 1 tablet (4 mg total) by mouth every 8 (eight) hours as needed for nausea or vomiting. 12/23/16   [place] N Russo, PA-C    Family History No family history on file.  Social History Social History  Substance Use Topics  . Smoking status: Never Smoker  . Smokeless  tobacco: Never Used  . Alcohol use No     Allergies   Patient has no known allergies.   Review of Systems Review of Systems  Constitutional: Negative for appetite change, chills and fever.  Cardiovascular: Negative for chest pain.  Gastrointestinal: Positive for abdominal pain (intermittent, diffuse, crampy), diarrhea and nausea (intermittent). Negative for blood in stool and constipation.  Genitourinary: Negative for dysuria, flank pain, frequency, hematuria, vaginal bleeding and vaginal discharge.  Musculoskeletal: Negative for back pain.     Physical Exam Updated Vital Signs BP 134/90 (BP Location: Left Arm)   Pulse 68   Temp 98.7 F (37.1 C) (Oral)   Resp 16   Ht  (1.778 m)   Wt 79.4 kg   LMP 11/24/2016   SpO2 100%   BMI 25.11 kg/m   Physical Exam  Constitutional: She appears well-developed and well-nourished.  Well-appearing.  HENT:  Head: Normocephalic and atraumatic.  Moist mucous membranes  Eyes: Conjunctivae are normal.  Cardiovascular: Normal rate, regular rhythm, normal heart sounds and intact distal pulses.  Exam reveals no friction rub.   No murmur heard. Pulmonary/Chest: Effort normal and breath sounds normal. No respiratory distress. She has no wheezes. She has no rales.  Abdominal: Soft. Bowel sounds are normal. She exhibits no distension. There is no tenderness. There is no rebound and no guarding.  No CVA tenderness to percussion  Neurological: She is alert.  Psychiatric: She has a normal mood and affect. Her behavior is normal.  Nursing note and vitals reviewed.    ED Treatments / Results  Labs (all labs ordered are listed, but only abnormal results are displayed) Labs Reviewed  COMPREHENSIVE METABOLIC PANEL - Abnormal; Notable for the following:       Result Value   Glucose, Bld 102 (*)    ALT 11 (*)    All other components within normal limits  URINALYSIS, ROUTINE W REFLEX MICROSCOPIC - Abnormal; Notable for the following:     APPearance HAZY (*)    Hgb urine dipstick SMALL (*)    Nitrite POSITIVE (*)    Leukocytes, UA MODERATE (*)    Bacteria, UA MANY (*)    Squamous Epithelial / LPF 0-5 (*)    All other components within normal limits  URINE CULTURE  LIPASE, BLOOD  CBC  I-STAT BETA HCG BLOOD, ED ([facility], [facility], [facility] ONLY)    EKG  EKG Interpretation None       Radiology No results found.  Procedures Procedures (including critical care time)  Medications Ordered in ED Medications - No data to display   Initial Impression / Assessment and Plan / ED Course  I have reviewed the triage vital signs and the nursing notes.  Pertinent labs & imaging results that were available during my care of the patient were reviewed by me and considered in my medical decision making (see chart for details).     Pt w diarrhea and UTI. Diarrhea is non-bloody without pus and improving. CBC, CMP, lipase wnl. U/A w UTI. Urine culture pending. Pt is well-appearing, not dehydrated, afebrile, benign abdomen,  no CVA tenderness, normotensive. Given pt's diarrhea and abnl U/A, will give Cipro to cover both and zofran PRN. Pt safe for discharge home.   Patient discussed with Dr. Donnald Garre.  Discussed results, findings, treatment and follow up. Patient advised of return precautions. Patient verbalized understanding and agreed with plan.    Final Clinical Impressions(s) / ED Diagnoses   Final diagnoses:  Acute UTI  Diarrhea in adult patient    New Prescriptions New Prescriptions   CIPROFLOXACIN (CIPRO) 500 MG TABLET    Take 1 tablet (500 mg total) by mouth every 12 (twelve) hours.   ONDANSETRON (ZOFRAN) 4 MG TABLET    Take 1 tablet (4 mg total) by mouth every 8 (eight) hours as needed for nausea or vomiting.     [place] N Russo, PA-C 12/23/16 1525    Arby Barrette, MD 12/23/16 [phone]

## 2016-12-25 LAB — URINE CULTURE

## 2016-12-26 ENCOUNTER — Telehealth: Payer: Self-pay | Admitting: Emergency Medicine

## 2016-12-26 NOTE — Telephone Encounter (Signed)
Post ED Visit - Positive Culture Follow-up  Culture report reviewed by antimicrobial stewardship pharmacist:   Enzo Bi, Pharm.D.  Celedonio Miyamoto, Pharm.D., BCPS AQ-ID  Garvin Fila, Pharm.D., BCPS  Georgina Pillion, Pharm.D., BCPS  [place], [address].D., BCPS, AAHIVP  Estella Husk, Pharm.D., BCPS, AAHIVP  Lysle Pearl, PharmD, BCPS  Casilda Carls, PharmD, BCPS  Pollyann Samples, PharmD, BCPS  Positive urine culture Treated with ciprofloxacin, organism sensitive to the same and no further patient follow-up is required at this time.  Berle Mull 12/26/2016, 3:22 PM
